# Patient Record
Sex: Male | Born: 1950 | Race: Asian | Hispanic: No | Marital: Single | Smoking: Never smoker
Health system: Southern US, Community
[De-identification: ages and names within clinical notes are randomized; demographics above are authoritative.]

## PROBLEM LIST (undated history)

## (undated) DIAGNOSIS — R079 Chest pain, unspecified: Secondary | ICD-10-CM

## (undated) DIAGNOSIS — E785 Hyperlipidemia, unspecified: Secondary | ICD-10-CM

## (undated) DIAGNOSIS — Z45018 Encounter for adjustment and management of other part of cardiac pacemaker: Secondary | ICD-10-CM

## (undated) DIAGNOSIS — I2510 Atherosclerotic heart disease of native coronary artery without angina pectoris: Secondary | ICD-10-CM

## (undated) DIAGNOSIS — R55 Syncope and collapse: Secondary | ICD-10-CM

## (undated) DIAGNOSIS — Z0389 Encounter for observation for other suspected diseases and conditions ruled out: Secondary | ICD-10-CM

## (undated) DIAGNOSIS — E1165 Type 2 diabetes mellitus with hyperglycemia: Secondary | ICD-10-CM

## (undated) DIAGNOSIS — R001 Bradycardia, unspecified: Secondary | ICD-10-CM

## (undated) DIAGNOSIS — R000 Tachycardia, unspecified: Secondary | ICD-10-CM

## (undated) HISTORY — DX: Type 2 diabetes mellitus with hyperglycemia: E11.65

## (undated) HISTORY — DX: Hyperlipidemia, unspecified: E78.5

## (undated) HISTORY — DX: Bradycardia, unspecified: R00.1

## (undated) HISTORY — DX: Syncope and collapse: R55

## (undated) HISTORY — DX: Encounter for observation for other suspected diseases and conditions ruled out: Z03.89

## (undated) HISTORY — DX: Encounter for adjustment and management of other part of cardiac pacemaker: Z45.018

## (undated) HISTORY — DX: Tachycardia, unspecified: R00.0

## (undated) HISTORY — DX: Chest pain, unspecified: R07.9

## (undated) HISTORY — DX: Atherosclerotic heart disease of native coronary artery without angina pectoris: I25.10

## (undated) HISTORY — PX: CARDIAC PACEMAKER PLACEMENT: SHX583

---

## 2003-11-17 ENCOUNTER — Encounter: Admission: RE | Admit: 2003-11-17 | Discharge: 2003-11-17 | Payer: Self-pay | Admitting: Internal Medicine

## 2004-02-03 ENCOUNTER — Encounter: Admission: RE | Admit: 2004-02-03 | Discharge: 2004-02-03 | Payer: Self-pay | Admitting: Internal Medicine

## 2006-03-12 ENCOUNTER — Ambulatory Visit (HOSPITAL_COMMUNITY): Admission: RE | Admit: 2006-03-12 | Discharge: 2006-03-12 | Payer: Self-pay | Admitting: Internal Medicine

## 2008-05-24 ENCOUNTER — Encounter: Admission: RE | Admit: 2008-05-24 | Discharge: 2008-05-24 | Payer: Self-pay | Admitting: Internal Medicine

## 2012-06-19 ENCOUNTER — Ambulatory Visit
Admission: RE | Admit: 2012-06-19 | Discharge: 2012-06-19 | Disposition: A | Discharge status: Home / Self Care | Payer: 59 | Source: Ambulatory Visit | Attending: Internal Medicine | Admitting: Internal Medicine

## 2012-06-19 ENCOUNTER — Other Ambulatory Visit: Payer: Self-pay | Admitting: Internal Medicine

## 2012-06-19 DIAGNOSIS — R109 Unspecified abdominal pain: Secondary | ICD-10-CM

## 2014-12-22 ENCOUNTER — Other Ambulatory Visit: Payer: Self-pay | Admitting: Internal Medicine

## 2014-12-22 ENCOUNTER — Ambulatory Visit
Admission: RE | Admit: 2014-12-22 | Discharge: 2014-12-22 | Disposition: A | Discharge status: Home / Self Care | Payer: Medicare Other | Source: Ambulatory Visit | Attending: Internal Medicine | Admitting: Internal Medicine

## 2014-12-22 DIAGNOSIS — R059 Cough, unspecified: Secondary | ICD-10-CM

## 2014-12-22 DIAGNOSIS — R05 Cough: Secondary | ICD-10-CM

## 2015-05-30 ENCOUNTER — Other Ambulatory Visit: Payer: Self-pay | Admitting: Internal Medicine

## 2015-05-30 ENCOUNTER — Ambulatory Visit
Admission: RE | Admit: 2015-05-30 | Discharge: 2015-05-30 | Disposition: A | Discharge status: Home / Self Care | Payer: Medicare Other | Source: Ambulatory Visit | Attending: Internal Medicine | Admitting: Internal Medicine

## 2015-05-30 DIAGNOSIS — R0602 Shortness of breath: Secondary | ICD-10-CM

## 2015-06-29 DIAGNOSIS — E1165 Type 2 diabetes mellitus with hyperglycemia: Secondary | ICD-10-CM

## 2015-06-29 DIAGNOSIS — E785 Hyperlipidemia, unspecified: Secondary | ICD-10-CM

## 2015-06-29 DIAGNOSIS — Z45018 Encounter for adjustment and management of other part of cardiac pacemaker: Secondary | ICD-10-CM

## 2015-06-29 DIAGNOSIS — R079 Chest pain, unspecified: Secondary | ICD-10-CM

## 2015-06-29 DIAGNOSIS — R001 Bradycardia, unspecified: Secondary | ICD-10-CM

## 2015-06-29 HISTORY — DX: Encounter for adjustment and management of other part of cardiac pacemaker: Z45.018

## 2015-06-29 HISTORY — DX: Bradycardia, unspecified: R00.1

## 2015-06-29 HISTORY — DX: Chest pain, unspecified: R07.9

## 2015-06-29 HISTORY — DX: Type 2 diabetes mellitus with hyperglycemia: E11.65

## 2015-06-29 HISTORY — DX: Hyperlipidemia, unspecified: E78.5

## 2015-07-04 HISTORY — PX: CARDIAC CATHETERIZATION: SHX172

## 2015-07-13 DIAGNOSIS — I2510 Atherosclerotic heart disease of native coronary artery without angina pectoris: Secondary | ICD-10-CM

## 2015-07-13 HISTORY — DX: Atherosclerotic heart disease of native coronary artery without angina pectoris: I25.10

## 2017-10-26 DIAGNOSIS — R55 Syncope and collapse: Secondary | ICD-10-CM | POA: Insufficient documentation

## 2017-10-26 DIAGNOSIS — R000 Tachycardia, unspecified: Secondary | ICD-10-CM

## 2017-10-26 HISTORY — DX: Syncope and collapse: R55

## 2017-10-26 HISTORY — DX: Tachycardia, unspecified: R00.0

## 2017-10-29 ENCOUNTER — Ambulatory Visit
Admission: RE | Admit: 2017-10-29 | Discharge: 2017-10-29 | Disposition: A | Discharge status: Home / Self Care | Payer: 59 | Source: Ambulatory Visit | Attending: Internal Medicine | Admitting: Internal Medicine

## 2017-10-29 ENCOUNTER — Other Ambulatory Visit: Payer: Self-pay | Admitting: Internal Medicine

## 2017-10-29 DIAGNOSIS — R079 Chest pain, unspecified: Secondary | ICD-10-CM

## 2017-10-29 DIAGNOSIS — R0602 Shortness of breath: Secondary | ICD-10-CM

## 2018-04-30 DIAGNOSIS — Z0389 Encounter for observation for other suspected diseases and conditions ruled out: Secondary | ICD-10-CM

## 2018-04-30 DIAGNOSIS — IMO0001 Reserved for inherently not codable concepts without codable children: Secondary | ICD-10-CM

## 2018-04-30 HISTORY — DX: Reserved for inherently not codable concepts without codable children: IMO0001

## 2018-09-02 ENCOUNTER — Encounter: Payer: Self-pay | Admitting: *Deleted

## 2018-09-02 ENCOUNTER — Other Ambulatory Visit: Payer: Self-pay | Admitting: *Deleted

## 2018-09-04 ENCOUNTER — Ambulatory Visit (INDEPENDENT_AMBULATORY_CARE_PROVIDER_SITE_OTHER): Payer: 59 | Admitting: Cardiology

## 2018-09-04 ENCOUNTER — Ambulatory Visit: Payer: 59 | Admitting: Cardiology

## 2018-09-04 ENCOUNTER — Encounter: Payer: Self-pay | Admitting: Cardiology

## 2018-09-04 VITALS — BP 120/62 | HR 60 | Wt 162.2 lb

## 2018-09-04 DIAGNOSIS — I2510 Atherosclerotic heart disease of native coronary artery without angina pectoris: Secondary | ICD-10-CM

## 2018-09-04 DIAGNOSIS — Z45018 Encounter for adjustment and management of other part of cardiac pacemaker: Secondary | ICD-10-CM

## 2018-09-04 DIAGNOSIS — R079 Chest pain, unspecified: Secondary | ICD-10-CM

## 2018-09-04 DIAGNOSIS — E1165 Type 2 diabetes mellitus with hyperglycemia: Secondary | ICD-10-CM

## 2018-09-04 DIAGNOSIS — R001 Bradycardia, unspecified: Secondary | ICD-10-CM

## 2018-09-04 DIAGNOSIS — E785 Hyperlipidemia, unspecified: Secondary | ICD-10-CM

## 2018-09-04 NOTE — Patient Instructions (Signed)
Medication Instructions:  Your physician recommends that you continue on your current medications as directed. Please refer to the Current Medication list given to you today.  If you need a refill on your cardiac medications before your next appointment, please call your pharmacy.   Lab work: None.  If you have labs (blood work) drawn today and your tests are completely normal, you will receive your results only by: Marland Kitchen. MyChart Message (if you have MyChart) OR . A paper copy in the mail If you have any lab test that is abnormal or we need to change your treatment, we will call you to review the results.  Testing/Procedures: Your physician has requested that you have an echocardiogram. Echocardiography is a painless test that uses sound waves to create images of your heart. It provides your doctor with information about the size and shape of your heart and how well your heart's chambers and valves are working. This procedure takes approximately one hour. There are no restrictions for this procedure.    Follow-Up: At [facility], you and your health needs are our priority.  As part of our continuing mission to provide you with exceptional heart care, we have created designated Provider Care Teams.  These Care Teams include your primary Cardiologist (physician) and Advanced Practice Providers (APPs -  Physician Assistants and Nurse Practitioners) who all work together to provide you with the care you need, when you need it. You will need a follow up appointment in 4 months.  Please call our office 2 months in advance to schedule this appointment.  You may see No primary care provider on file. or another member of our [facility] Provider Team in [place]: Norman HerrlichBrian Munley, MD . Belva Cromeajan Revankar, MD  Any Other Special Instructions Will Be Listed Below (If Applicable).  Dr. Bing MatterKrasowski has referred you to see Dr. Elberta Fortisamnitz with electrophysiology.   Echocardiogram An echocardiogram, or  echocardiography, uses sound waves (ultrasound) to produce an image of your heart. The echocardiogram is simple, painless, obtained within a short period of time, and offers valuable information to your health care provider. The images from an echocardiogram can provide information such as:  Evidence of coronary artery disease (CAD).  Heart size.  Heart muscle function.  Heart valve function.  Aneurysm detection.  Evidence of a past heart attack.  Fluid buildup around the heart.  Heart muscle thickening.  Assess heart valve function.  Tell a health care provider about:  Any allergies you have.  All medicines you are taking, including vitamins, herbs, eye drops, creams, and over-the-counter medicines.  Any problems you or family members have had with anesthetic medicines.  Any blood disorders you have.  Any surgeries you have had.  Any medical conditions you have.  Whether you are pregnant or may be pregnant. What happens before the procedure? No special preparation is needed. Eat and drink normally. What happens during the procedure?  In order to produce an image of your heart, gel will be applied to your chest and a wand-like tool (transducer) will be moved over your chest. The gel will help transmit the sound waves from the transducer. The sound waves will harmlessly bounce off your heart to allow the heart images to be captured in real-time motion. These images will then be recorded.  You may need an IV to receive a medicine that improves the quality of the pictures. What happens after the procedure? You may return to your normal schedule including diet, activities, and medicines, unless your health  care provider tells you otherwise. This information is not intended to replace advice given to you by your health care provider. Make sure you discuss any questions you have with your health care provider. Document Released: 10/04/2000 Document Revised: 05/25/2016 Document  Reviewed: 06/14/2013 Elsevier Interactive Patient Education  2017 [employer].

## 2018-09-04 NOTE — Progress Notes (Signed)
Cardiology Office Note:    Date:  09/04/2018   ID:  Terry LynchPhilip Joyce, DOB 04/10/1951, MRN [id number]  PCP:  Terry OkMoreira, Roy, MD  Cardiologist:  Gypsy Balsamobert Krasowski, MD    Referring MD: Terry OkMoreira, Roy, MD   Chief Complaint  Patient presents with  . Follow-up  Doing well  History of Present Illness:    Terry Joyce is a 67 y.o. male with poorly controlled diabetes.  He did have a cardiac catheterization few years ago which showed normal coronaries he does have pacemaker which is an Abbot  device that the last time check in March he had a 1.1-year left in the device.  He denies have any cardiac complaints no chest pain tightness squeezing pressure burning chest he described numbness in the right arm with radiation going towards his little finger he sustained some injury in his back many years ago and he thinks that this is related to this injury and he is most likely light right he is active trying to walk climb stairs no difficulty doing it does yoga every day and does some additional exercises with no difficulties the biggest problem is always poorly controlled diabetes.  Past Medical History:  Diagnosis Date  . 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 Coronary artery disease involving native coronary artery of native heart without angina pectoris 07/13/2015   Overview:  Luminal disease by cardiac catheter from September 2016  . Dyslipidemia 06/29/2015  . Exertional chest pain 06/29/2015  . Normal coronary arteries 04/30/2018  . Pacemaker reprogramming/check 06/29/2015  . Poorly controlled diabetes mellitus (HCC) 06/29/2015  . Pre-syncope 10/26/2017  . Tachycardia 10/26/2017      Current Medications: Current Meds  Medication Sig  . aspirin EC 81 MG tablet Take by mouth daily.  Marland Kitchen. atorvastatin (LIPITOR) 10 MG tablet Take by mouth.  . EPINEPHrine (EPIPEN JR) 0.15 MG/0.3ML injection Inject as directed.  . insulin aspart (NOVOLOG) 100 UNIT/ML FlexPen Inject into the skin daily.  . insulin degludec (TRESIBA)  100 UNIT/ML SOPN FlexTouch Pen Inject into the skin daily.     Allergies:   Penicillins   Social History   Socioeconomic History  . Marital status: Single    Spouse name: Not on file  . Number of children: Not on file  . Years of education: Not on file  . Highest education level: Not on file  Occupational History  . Not on file  Social Needs  . Financial resource strain: Not on file  . Food insecurity:    Worry: Not on file    Inability: Not on file  . Transportation needs:    Medical: Not on file    Non-medical: Not on file  Tobacco Use  . Smoking status: Never Smoker  . Smokeless tobacco: Never Used  Substance and Sexual Activity  . Alcohol use: Yes  . Drug use: Not Currently  . Sexual activity: Not on file  Lifestyle  . Physical activity:    Days per week: Not on file    Minutes per session: Not on file  . Stress: Not on file  Relationships  . Social connections:    Talks on phone: Not on file    Gets together: Not on file    Attends religious service: Not on file    Active member of club or organization: Not on file    Attends meetings of clubs or organizations: Not on file    Relationship status: Not on file  Other Topics Concern  . Not on file  Social History  Narrative  . Not on file     Family History: The patient's family history includes Colon cancer in his father; Diabetes in his mother. ROS:   Please see the history of present illness.    All 14 point review of systems negative except as described per history of present illness  EKGs/Labs/Other Studies Reviewed:      Recent Labs: No results found for requested labs within last 8760 hours.  Recent Lipid Panel No results found for: CHOL, TRIG, HDL, CHOLHDL, VLDL, LDLCALC, LDLDIRECT  Physical Exam:    VS:  BP 120/62   Pulse 60   Wt 162 lb 3.2 oz (73.6 kg)   SpO2 96%     Wt Readings from Last 3 Encounters:  09/04/18 162 lb 3.2 oz (73.6 kg)     GEN:  Well nourished, well developed in no  acute distress HEENT: Normal NECK: No JVD; No carotid bruits LYMPHATICS: No lymphadenopathy CARDIAC: RRR, no murmurs, no rubs, no gallops RESPIRATORY:  Clear to auscultation without rales, wheezing or rhonchi  ABDOMEN: Soft, non-tender, non-distended MUSCULOSKELETAL:  No edema; No deformity  SKIN: Warm and dry LOWER EXTREMITIES: no swelling NEUROLOGIC:  Alert and oriented x 3 PSYCHIATRIC:  Normal affect   ASSESSMENT:    1. Dyslipidemia   2. Bradycardia   3. Poorly controlled diabetes mellitus (HCC)    PLAN:    In order of problems listed above:  1. Pacemaker present we will make arrangements for pacemaker interrogation then will enroll him in our pacemaker clinic. 2. Poorly controlled diabetes.  He is working hard with primary care physician and endocrinologist trying to improve it.  Will continue to push towards better control of this problem.  He blames it on very unscheduled EP habits. 3. Bradycardia may be addressed with pacemaker. 4. He does have some chest pain which is very atypical twinges like he had it even before when I did a cardiac catheterization which was negative for ischemia. 5. I will schedule him to have echocardiogram to assess left ventricular ejection fraction especially since he does have some exertional shortness of breath.   Medication Adjustments/Labs and Tests Ordered: Current medicines are reviewed at length with the patient today.  Concerns regarding medicines are outlined above.  No orders of the defined types were placed in this encounter.  Medication changes: No orders of the defined types were placed in this encounter.   Signed, Georgeanna Lea, MD, [facility] 09/04/2018 11:07 AM     [facility]

## 2018-09-09 ENCOUNTER — Encounter: Payer: 59 | Admitting: Cardiology

## 2018-10-22 ENCOUNTER — Ambulatory Visit (INDEPENDENT_AMBULATORY_CARE_PROVIDER_SITE_OTHER): Payer: 59

## 2018-10-22 DIAGNOSIS — I2510 Atherosclerotic heart disease of native coronary artery without angina pectoris: Secondary | ICD-10-CM | POA: Diagnosis not present

## 2018-10-22 NOTE — Progress Notes (Signed)
Complete echocardiogram has been performed.  Jimmy Kallin Henk RDCS, RVT 

## 2018-10-30 ENCOUNTER — Telehealth: Payer: Self-pay | Admitting: Emergency Medicine

## 2018-10-30 NOTE — Telephone Encounter (Signed)
Left results of echocardiogram on patient voicemail per dpr. Advised him to call back with any questions

## 2018-11-09 ENCOUNTER — Encounter: Payer: 59 | Admitting: Cardiology

## 2018-11-09 NOTE — Progress Notes (Deleted)
Electrophysiology Office Note   Date:  11/09/2018   ID:  Chrisotpher Joyce, DOB 05/04/1951, MRN [id number]  PCP:  Ralene Ok, MD  Cardiologist:  Bing Matter Primary Electrophysiologist:   Jorja Loa, MD    No chief complaint on file.    History of Present Illness: Terry Joyce is a 68 y.o. male who is being seen today for the evaluation of pacemaker at the request of Gypsy Balsam. Presenting today for electrophysiology evaluation.  He has a history significant for coronary artery disease, bradycardia status post [facility] pacemaker, diabetes, hyperlipidemia.  Today, he denies*** symptoms of palpitations, chest pain, shortness of breath, orthopnea, PND, lower extremity edema, claudication, dizziness, presyncope, syncope, bleeding, or neurologic sequela. The patient is tolerating medications without difficulties.    Past Medical History:  Diagnosis Date  . 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 Coronary artery disease involving native coronary artery of native heart without angina pectoris 07/13/2015   Overview:  Luminal disease by cardiac catheter from September 2016  . Dyslipidemia 06/29/2015  . Exertional chest pain 06/29/2015  . Normal coronary arteries 04/30/2018  . Pacemaker reprogramming/check 06/29/2015  . Poorly controlled diabetes mellitus (HCC) 06/29/2015  . Pre-syncope 10/26/2017  . Tachycardia 10/26/2017   *** The histories are not reviewed yet. Please review them in the "History" navigator section and refresh this SmartLink.   Current Outpatient Medications  Medication Sig Dispense Refill  . aspirin EC 81 MG tablet Take by mouth daily.    Marland Kitchen atorvastatin (LIPITOR) 10 MG tablet Take by mouth.    . EPINEPHrine (EPIPEN JR) 0.15 MG/0.3ML injection Inject as directed.    . insulin aspart (NOVOLOG) 100 UNIT/ML FlexPen Inject into the skin daily.    . insulin degludec (TRESIBA) 100 UNIT/ML SOPN FlexTouch Pen Inject into the skin daily.     No current facility-administered  medications for this visit.     Allergies:   Penicillins   Social History:  The patient  reports that he has never smoked. He has never used smokeless tobacco. He reports current alcohol use. He reports previous drug use.   Family History:  The patient's ***family history includes Colon cancer in his father; Diabetes in his mother.    ROS:  Please see the history of present illness.   Otherwise, review of systems is positive for ***.   All other systems are reviewed and negative.    PHYSICAL EXAM: VS:  There were no vitals taken for this visit. , BMI There is no height or weight on file to calculate BMI. GEN: Well nourished, well developed, in no acute distress  HEENT: normal  Neck: no JVD, carotid bruits, or masses Cardiac: ***RRR; no murmurs, rubs, or gallops,no edema  Respiratory:  clear to auscultation bilaterally, normal work of breathing GI: soft, nontender, nondistended, + BS MS: no deformity or atrophy  Skin: warm and dry, device pocket is well healed Neuro:  Strength and sensation are intact Psych: euthymic mood, full affect  EKG:  EKG {ACTION; IS/IS ATF:57322025} ordered today. Personal review of the ekg ordered shows ***  Device interrogation is reviewed today in detail.  See PaceArt for details.   Recent Labs: No results found for requested labs within last 8760 hours.    Lipid Panel  No results found for: CHOL, TRIG, HDL, CHOLHDL, VLDL, LDLCALC, LDLDIRECT   Wt Readings from Last 3 Encounters:  09/04/18 162 lb 3.2 oz (73.6 kg)      Other studies Reviewed: Additional studies/ records that were reviewed today include: TTE  10/22/18  Review of the above records today demonstrates:  - Left ventricle: The cavity size was normal. Wall thickness was   normal. Systolic function was normal. The estimated ejection   fraction was in the range of 60% to 65%. Wall motion was normal;   there were no regional wall motion abnormalities. Features are   consistent with a  pseudonormal left ventricular filling pattern,   with concomitant abnormal relaxation and increased filling   pressure (grade 2 diastolic dysfunction). - Right ventricle: Pacer wire or catheter noted in right ventricle. - Right atrium: Pacer wire or catheter noted in right atrium.   ASSESSMENT AND PLAN:  1.  Sick sinus syndrome: Status post Saint Jude dual-chamber pacemaker.***  2.  Hyperlipidemia: Plan per primary cardiology    Current medicines are reviewed at length with the patient today.   The patient {ACTIONS; HAS/DOES NOT HAVE:19233} concerns regarding his medicines.  The following changes were made today:  {NONE DEFAULTED:18576::"none"}  Labs/ tests ordered today include: *** No orders of the defined types were placed in this encounter.    Disposition:   FU with   {gen number [id number]} {Days to years:10300}  Signed,  Jorja LoaMartin , MD  11/09/2018 11:33 AM     [facility] [address] [place] [address] [phone] (office) [phone] (fax)

## 2018-11-24 NOTE — Progress Notes (Signed)
This encounter was created in error - please disregard.

## 2018-11-30 IMAGING — CR DG CHEST 2V
2 series · 2 of 2 positions shown · non-contrast
Comparison: 07/04/2015 and prior radiographs

CLINICAL DATA: Left chest pain and shortness of breath for 1 week.

EXAM:
CHEST  2 VIEW

[w chest pa]
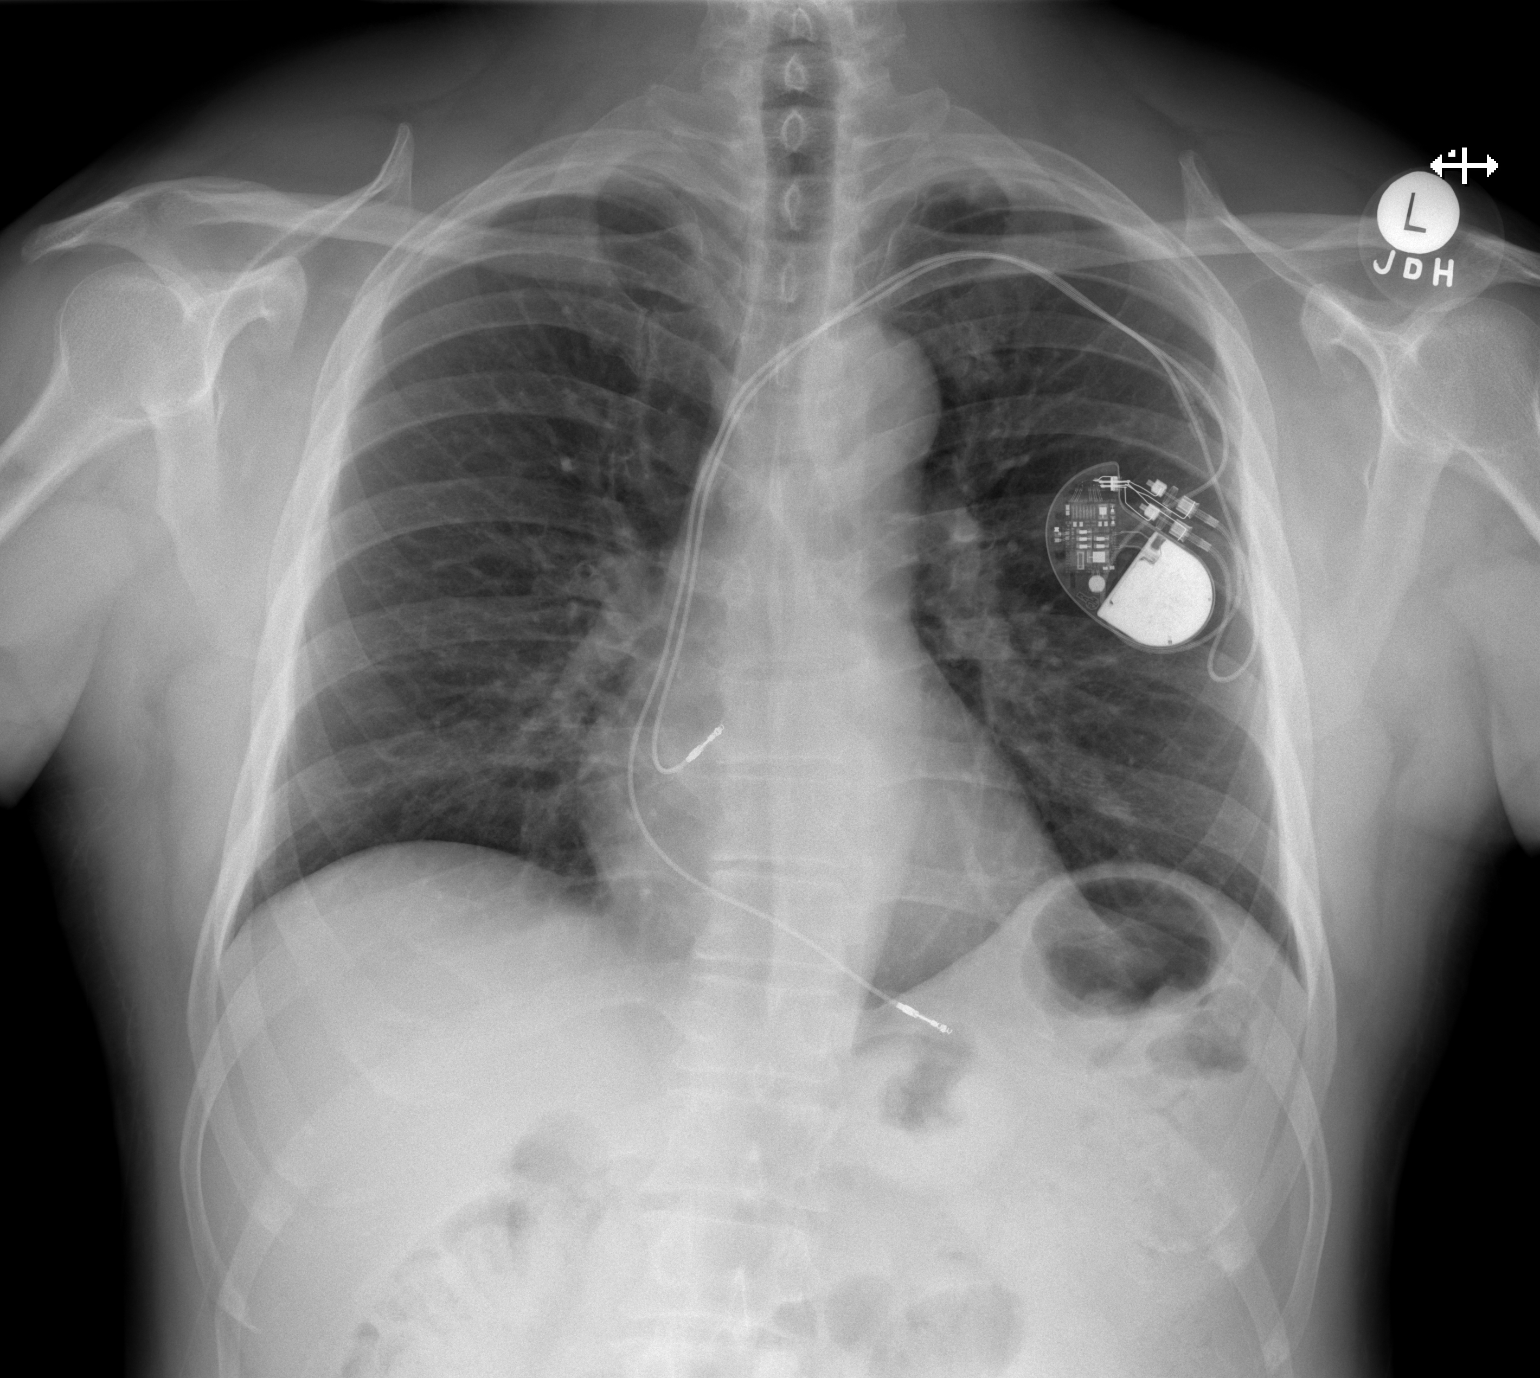

[w chest lat]
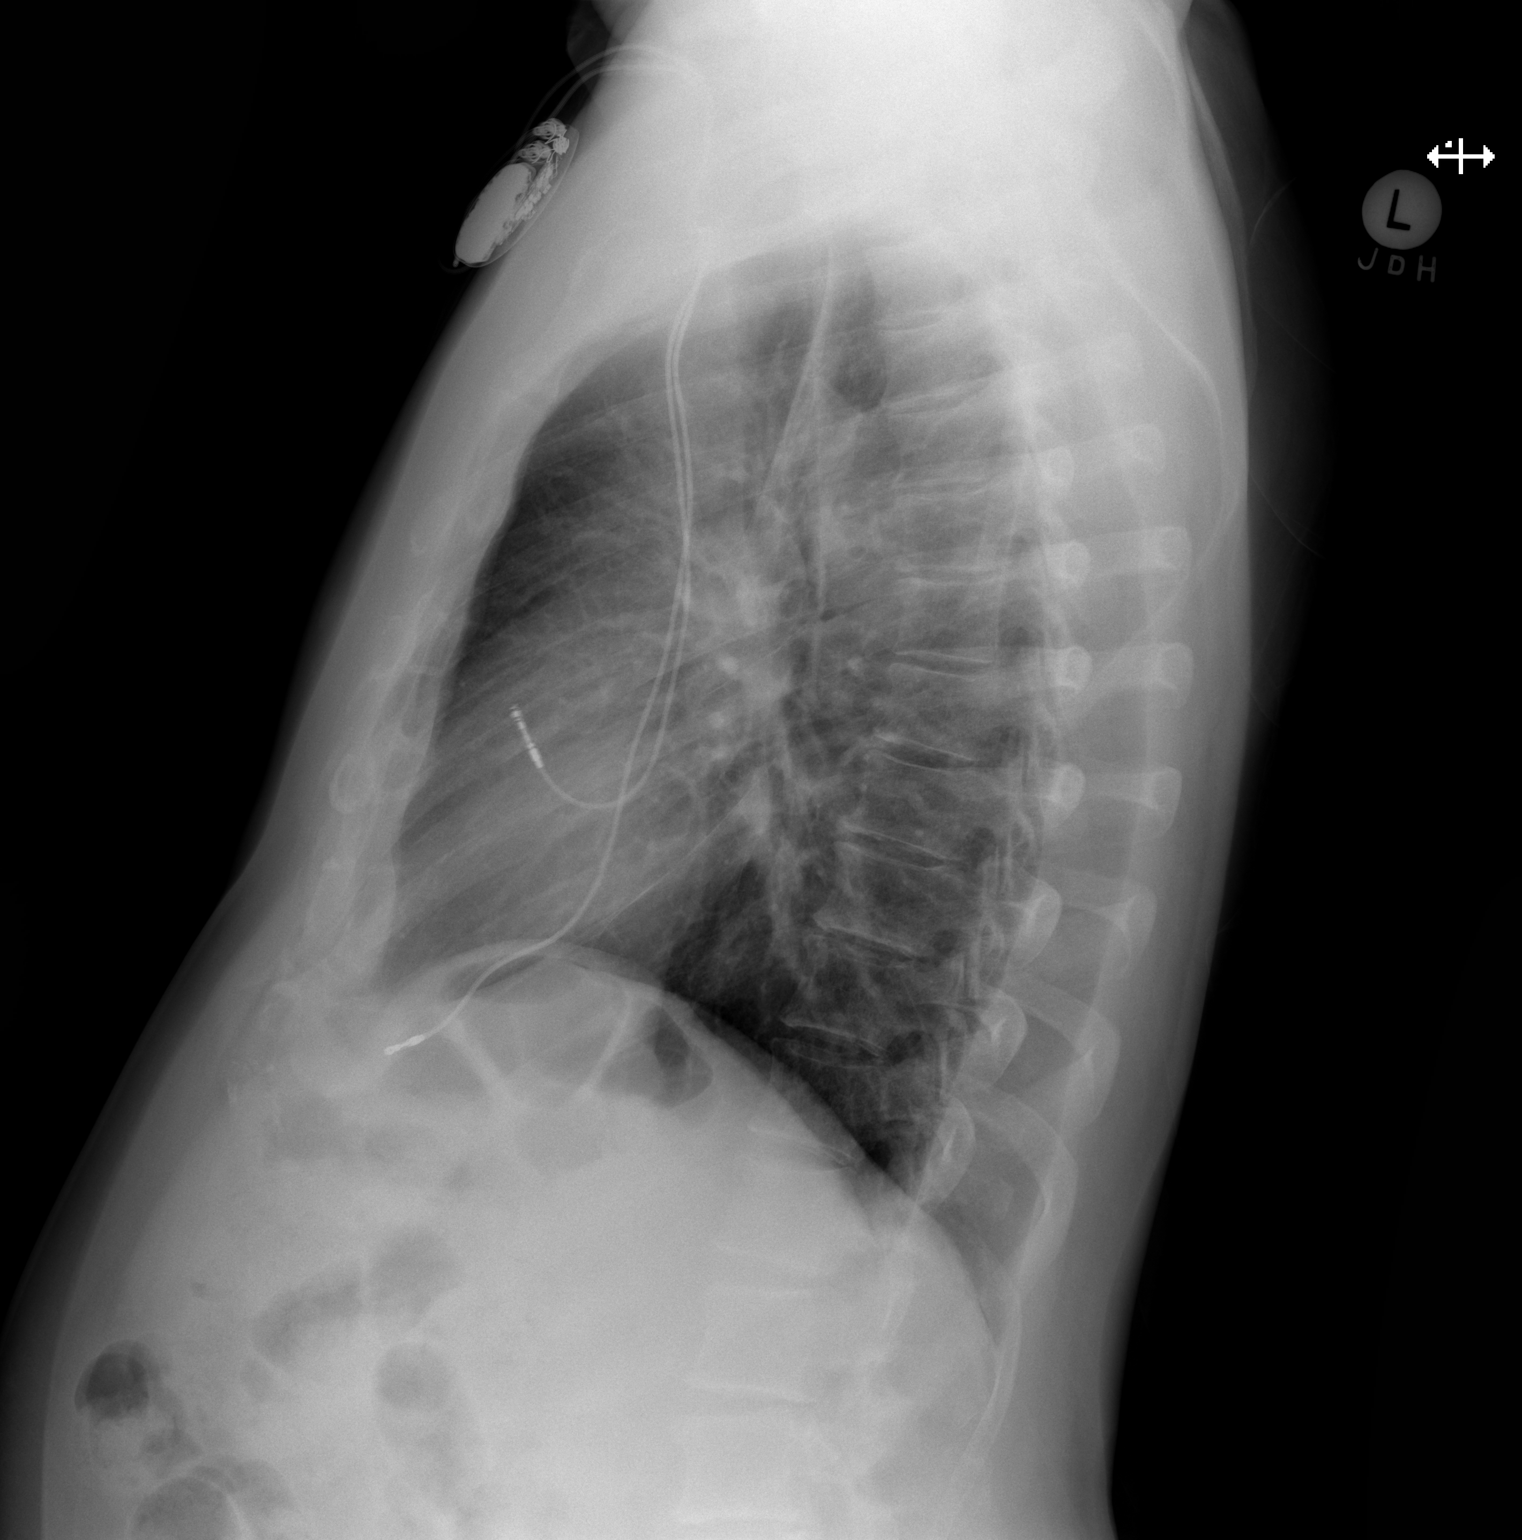

[2 of 2 positions shown; findings below may reference images not displayed]

FINDINGS: The cardiomediastinal silhouette is unremarkable.

Left-sided pacemaker is unchanged.

There is no evidence of focal airspace disease, pulmonary edema,
suspicious pulmonary nodule/mass, pleural effusion, or pneumothorax.

No acute bony abnormalities are identified.
IMPRESSION: No active cardiopulmonary disease.

## 2018-12-10 ENCOUNTER — Encounter: Payer: Self-pay | Admitting: Cardiology

## 2018-12-10 ENCOUNTER — Ambulatory Visit (INDEPENDENT_AMBULATORY_CARE_PROVIDER_SITE_OTHER): Payer: 59 | Admitting: Cardiology

## 2018-12-10 VITALS — BP 120/60 | HR 62 | Ht 64.0 in | Wt 165.4 lb

## 2018-12-10 DIAGNOSIS — IMO0001 Reserved for inherently not codable concepts without codable children: Secondary | ICD-10-CM

## 2018-12-10 DIAGNOSIS — Z0389 Encounter for observation for other suspected diseases and conditions ruled out: Secondary | ICD-10-CM | POA: Diagnosis not present

## 2018-12-10 DIAGNOSIS — R079 Chest pain, unspecified: Secondary | ICD-10-CM | POA: Diagnosis not present

## 2018-12-10 DIAGNOSIS — Z950 Presence of cardiac pacemaker: Secondary | ICD-10-CM

## 2018-12-10 DIAGNOSIS — R0789 Other chest pain: Secondary | ICD-10-CM

## 2018-12-10 DIAGNOSIS — E1165 Type 2 diabetes mellitus with hyperglycemia: Secondary | ICD-10-CM

## 2018-12-10 HISTORY — DX: Other chest pain: R07.89

## 2018-12-10 HISTORY — DX: Presence of cardiac pacemaker: Z95.0

## 2018-12-10 NOTE — Patient Instructions (Addendum)
Medication Instructions:  Your physician recommends that you continue on your current medications as directed. Please refer to the Current Medication list given to you today.  If you need a refill on your cardiac medications before your next appointment, please call your pharmacy.   Lab work: Your physician recommends that you return for lab work today: cbc, with diff, sedimentation rate  If you have labs (blood work) drawn today and your tests are completely normal, you will receive your results only by: Marland Kitchen MyChart Message (if you have MyChart) OR . A paper copy in the mail If you have any lab test that is abnormal or we need to change your treatment, we will call you to review the results.  Testing/Procedures: Your physician has requested that you have a upper extremity venous duplex.  Allow one hour for this exam. There are no restrictions or special instructions.   Follow-Up: At [facility], you and your health needs are our priority.  As part of our continuing mission to provide you with exceptional heart care, we have created designated Provider Care Teams.  These Care Teams include your primary Cardiologist (physician) and Advanced Practice Providers (APPs -  Physician Assistants and Nurse Practitioners) who all work together to provide you with the care you need, when you need it. You will need a follow up appointment in 1 months.  Please call our office 2 months in advance to schedule this appointment.  You may see No primary care provider on file. or another member of our [facility] Provider Team in [place]: Norman Herrlich, MD . Belva Crome, MD  Any Other Special Instructions Will Be Listed Below (If Applicable).   You are scheduled Monday at 830 to have you pacemaker interrogated in [place].

## 2018-12-10 NOTE — Progress Notes (Signed)
Cardiology Office Note:    Date:  12/10/2018   ID:  Terry Joyce, DOB 11/03/1950, MRN [id number]  PCP:  Jilda Panda, MD  Cardiologist:  Jenne Campus, MD    Referring MD: Jilda Panda, MD   Chief Complaint  Patient presents with  . Check pacemaker site    Having pain in the location of the pacemaker and into the shoulder, feels vibration  I have a pain in the pacemaker area  History of Present Illness:    Terry Joyce is a 68 y.o. male with pacemaker, diabetes which is poorly controlled requested to be seen because of pain around pacemaker site.  He is working hard in the garden and apparently few weeks ago he was using a chainsaw since that time he has been complaining of having pain around pacemaker area.  A look at the side of the pacemaker looks pristine there is no swelling there is no redness there is minimal tenderness on palpation he is pointing especially in the area where leads are attached to the pacemaker.  On top of that he is complained of having some pain in the left shoulder moving shoulder will give him more pain also pain going towards the neck he is complaining having headaches and he thinks that these headaches are related to pacemaker he also noted that his diabetes appears to be much more difficult to control lately he is worried that he may have infection in the pacemaker site.  I told him that would be rather unlikely to have infection of the pacemaker in such a stable device that he has already for more than 10 years.  However I think it would be reasonable to perform some blood test to see if there is any evidence of infection or inflammation.  I will also schedule him to have ultrasounds of the new system of the left shoulder to make sure there is no acute thrombosis that is responsible for his symptoms likely his arm is not swollen pulses are normal.  Otherwise he seems to be doing well  Past Medical History:  Diagnosis Date  . 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 Coronary artery disease involving native coronary artery of native heart without angina pectoris 07/13/2015   Overview:  Luminal disease by cardiac catheter from September 2016  . Dyslipidemia 06/29/2015  . Exertional chest pain 06/29/2015  . Normal coronary arteries 04/30/2018  . Pacemaker reprogramming/check 06/29/2015  . Poorly controlled diabetes mellitus ([place]) 06/29/2015  . Pre-syncope 10/26/2017  . Tachycardia 10/26/2017      Current Medications: Current Meds  Medication Sig  . aspirin EC 81 MG tablet Take by mouth daily.  Marland Kitchen EPINEPHrine (EPIPEN JR) 0.15 MG/0.3ML injection Inject as directed.  Marland Kitchen FIASP FLEXTOUCH 100 UNIT/ML SOPN   . insulin degludec (TRESIBA) 100 UNIT/ML SOPN FlexTouch Pen Inject into the skin daily.     Allergies:   Penicillins   Social History   Socioeconomic History  . Marital status: Single    Spouse name: Not on file  . Number of children: Not on file  . Years of education: Not on file  . Highest education level: Not on file  Occupational History  . Not on file  Social Needs  . Financial resource strain: Not on file  . Food insecurity:    Worry: Not on file    Inability: Not on file  . Transportation needs:    Medical: Not on file    Non-medical: Not on file  Tobacco Use  .  Smoking status: Never Smoker  . Smokeless tobacco: Never Used  Substance and Sexual Activity  . Alcohol use: Yes  . Drug use: Not Currently  . Sexual activity: Not on file  Lifestyle  . Physical activity:    Days per week: Not on file    Minutes per session: Not on file  . Stress: Not on file  Relationships  . Social connections:    Talks on phone: Not on file    Gets together: Not on file    Attends religious service: Not on file    Active member of club or organization: Not on file    Attends meetings of clubs or organizations: Not on file    Relationship status: Not on file  Other Topics Concern  . Not on file  Social History Narrative  . Not on file     Family  History: The patient's family history includes Colon cancer in his father; Diabetes in his mother. ROS:   Please see the history of present illness.    All 14 point review of systems negative except as described per history of present illness  EKGs/Labs/Other Studies Reviewed:      Recent Labs: No results found for requested labs within last 8760 hours.  Recent Lipid Panel No results found for: CHOL, TRIG, HDL, CHOLHDL, VLDL, LDLCALC, LDLDIRECT  Physical Exam:    VS:  BP 120/60   Pulse 62   Ht 5' 4"  (1.626 m)   Wt 165 lb 6.4 oz (75 kg)   SpO2 96%   BMI 28.39 kg/m     Wt Readings from Last 3 Encounters:  12/10/18 165 lb 6.4 oz (75 kg)  09/04/18 162 lb 3.2 oz (73.6 kg)     GEN:  Well nourished, well developed in no acute distress HEENT: Normal NECK: No JVD; No carotid bruits LYMPHATICS: No lymphadenopathy CARDIAC: RRR, no murmurs, no rubs, no gallops RESPIRATORY:  Clear to auscultation without rales, wheezing or rhonchi  ABDOMEN: Soft, non-tender, non-distended MUSCULOSKELETAL:  No edema; No deformity  SKIN: Warm and dry LOWER EXTREMITIES: no swelling NEUROLOGIC:  Alert and oriented x 3 PSYCHIATRIC:  Normal affect   ASSESSMENT:    1. Exertional chest pain   2. Atypical chest pain   3. Pacemaker   4. Normal coronary arteries   5. Poorly controlled diabetes mellitus ([place])    PLAN:    In order of problems listed above:  1. Atypical chest pain.  Not related to his heart in the pacemaker area I suspect musculoskeletal however will do ultrasounds of his subclavian vein make sure there is no significant thrombosis there.  Overall I doubt that his symptoms are related to pacemaker. 2. Normal coronary arteries: Noted last cardiac catheterization. 3. Diabetes which is difficult to control again he had some discussion about this he is very reluctant to take any medications in the matter-of-fact told him to take some Tylenol for shoulder pain he does not want to do  that   Medication Adjustments/Labs and Tests Ordered: Current medicines are reviewed at length with the patient today.  Concerns regarding medicines are outlined above.  Orders Placed This Encounter  Procedures  . CBC with Differential  . Sed Rate (ESR)   Medication changes: No orders of the defined types were placed in this encounter.   Signed, Park Liter, MD, [facility] 12/10/2018 11:54 AM    [place]

## 2018-12-11 LAB — CBC WITH DIFFERENTIAL/PLATELET
BASOS: 1 %
Basophils Absolute: 0.1 10*3/uL (ref 0.0–0.2)
EOS (ABSOLUTE): 0.3 10*3/uL (ref 0.0–0.4)
EOS: 4 %
HEMATOCRIT: 45.1 % (ref 37.5–51.0)
HEMOGLOBIN: 15.4 g/dL (ref 13.0–17.7)
Immature Grans (Abs): 0 10*3/uL (ref 0.0–0.1)
Immature Granulocytes: 1 %
LYMPHS ABS: 2.1 10*3/uL (ref 0.7–3.1)
LYMPHS: 32 %
MCH: 29.8 pg (ref 26.6–33.0)
MCHC: 34.1 g/dL (ref 31.5–35.7)
MCV: 87 fL (ref 79–97)
MONOCYTES: 8 %
Monocytes Absolute: 0.5 10*3/uL (ref 0.1–0.9)
NEUTROS PCT: 54 %
Neutrophils Absolute: 3.5 10*3/uL (ref 1.4–7.0)
Platelets: 194 10*3/uL (ref 150–450)
RBC: 5.17 x10E6/uL (ref 4.14–5.80)
RDW: 12.7 % (ref 11.6–15.4)
WBC: 6.4 10*3/uL (ref 3.4–10.8)

## 2018-12-11 LAB — SEDIMENTATION RATE: Sed Rate: 5 mm/hr (ref 0–30)

## 2018-12-14 ENCOUNTER — Telehealth: Payer: Self-pay | Admitting: Emergency Medicine

## 2018-12-14 DIAGNOSIS — Z950 Presence of cardiac pacemaker: Secondary | ICD-10-CM

## 2018-12-14 NOTE — Telephone Encounter (Signed)
Patient had pacemaker interrogation today. He needs a new pacemaker I have sent staff messages to Dory Horn, RN and Glynda Jaeger to get him scheduled soon.

## 2018-12-14 NOTE — Addendum Note (Signed)
Addended by: Lita Mains on: 12/14/2018 04:48 PM   Modules accepted: Orders

## 2018-12-22 ENCOUNTER — Ambulatory Visit (INDEPENDENT_AMBULATORY_CARE_PROVIDER_SITE_OTHER): Payer: 59 | Admitting: Cardiology

## 2018-12-22 ENCOUNTER — Encounter: Payer: Self-pay | Admitting: Cardiology

## 2018-12-22 ENCOUNTER — Ambulatory Visit (HOSPITAL_BASED_OUTPATIENT_CLINIC_OR_DEPARTMENT_OTHER)
Admission: RE | Admit: 2018-12-22 | Discharge: 2018-12-22 | Disposition: A | Discharge status: Home / Self Care | Payer: 59 | Source: Ambulatory Visit | Attending: Cardiology | Admitting: Cardiology

## 2018-12-22 VITALS — BP 132/74 | HR 54 | Ht 64.0 in | Wt 167.0 lb

## 2018-12-22 DIAGNOSIS — I495 Sick sinus syndrome: Secondary | ICD-10-CM

## 2018-12-22 DIAGNOSIS — Z01812 Encounter for preprocedural laboratory examination: Secondary | ICD-10-CM | POA: Diagnosis not present

## 2018-12-22 DIAGNOSIS — R079 Chest pain, unspecified: Secondary | ICD-10-CM | POA: Diagnosis present

## 2018-12-22 LAB — CUP PACEART INCLINIC DEVICE CHECK
MDC IDC PG IMPLANT DT: 20090402
MDC IDC SESS DTM: 20200303123445
Pulse Gen Serial Number: 1190957

## 2018-12-22 NOTE — Addendum Note (Signed)
Addended by: Baird Lyons on: 12/22/2018 01:15 PM   Modules accepted: Orders

## 2018-12-22 NOTE — Addendum Note (Signed)
Addended by: Marylou Flesher on: 12/22/2018 01:32 PM   Modules accepted: Orders

## 2018-12-22 NOTE — Progress Notes (Signed)
Electrophysiology Office Note   Date:  12/22/2018   ID:  Terry Joyce, DOB [birth year], MRN [id number]  PCP:  Ralene Ok, MD  Cardiologist:  Bing Matter Primary Electrophysiologist:  Silus Lanzo Jorja Loa, MD    No chief complaint on file.    History of Present Illness: Terry Joyce is a 68 y.o. male who is being seen today for the evaluation of pacemkaer at the request of Gypsy Balsam. Presenting today for electrophysiology evaluation.  He has a history of coronary artery disease, hyperlipidemia, diabetes.  Today, he denies symptoms of palpitations, chest pain, shortness of breath, orthopnea, PND, lower extremity edema, claudication, dizziness, presyncope, syncope, bleeding, or neurologic sequela. The patient is tolerating medications without difficulties.  The patient's main complaint is pain over his pacemaker site.  This is been going on for many months.  There is no obvious sign of infection based on his prior history.  Labs were tested and he does not have a current white count.   Past Medical History:  Diagnosis Date  . 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 Coronary artery disease involving native coronary artery of native heart without angina pectoris 07/13/2015   Overview:  Luminal disease by cardiac catheter from September 2016  . Dyslipidemia 06/29/2015  . Exertional chest pain 06/29/2015  . Normal coronary arteries 04/30/2018  . Pacemaker reprogramming/check 06/29/2015  . Poorly controlled diabetes mellitus (HCC) 06/29/2015  . Pre-syncope 10/26/2017  . Tachycardia 10/26/2017   Past Surgical History:  Procedure Laterality Date  . CARDIAC CATHETERIZATION Left 07/04/2015   Surgeon: Derrek Gu, MD; Location: [facility] CATH; Service: Cardiology  . CARDIAC PACEMAKER PLACEMENT       Current Outpatient Medications  Medication Sig Dispense Refill  . aspirin EC 81 MG tablet Take by mouth daily.    Marland Kitchen EPINEPHrine (EPIPEN JR) 0.15 MG/0.3ML injection Inject as directed.    Marland Kitchen FIASP  FLEXTOUCH 100 UNIT/ML SOPN     . insulin degludec (TRESIBA) 100 UNIT/ML SOPN FlexTouch Pen Inject into the skin daily.     No current facility-administered medications for this visit.     Allergies:   Penicillins   Social History:  The patient  reports that he has never smoked. He has never used smokeless tobacco. He reports current alcohol use. He reports previous drug use.   Family History:  The patient's family history includes Colon cancer in his father; Diabetes in his mother.    ROS:  Please see the history of present illness.   Otherwise, review of systems is positive for muscle pain.   All other systems are reviewed and negative.    PHYSICAL EXAM: VS:  BP 132/74   Pulse (!) 54   Ht 5\' 4"  (1.626 m)   Wt 167 lb (75.8 kg)   BMI 28.67 kg/m  , BMI Body mass index is 28.67 kg/m. GEN: Well nourished, well developed, in no acute distress  HEENT: normal  Neck: no JVD, carotid bruits, or masses Cardiac: RRR; no murmurs, rubs, or gallops,no edema  Respiratory:  clear to auscultation bilaterally, normal work of breathing GI: soft, nontender, nondistended, + BS MS: no deformity or atrophy  Skin: warm and dry, device pocket is well healed Neuro:  Strength and sensation are intact Psych: euthymic mood, full affect  EKG:  EKG is ordered today. Personal review of the ekg ordered shows atrial paced, rate 54   Device interrogation is reviewed today in detail.  See PaceArt for details.   Recent Labs: 12/10/2018: Hemoglobin 15.4; Platelets 194  Lipid Panel  No results found for: CHOL, TRIG, HDL, CHOLHDL, VLDL, LDLCALC, LDLDIRECT   Wt Readings from Last 3 Encounters:  12/22/18 167 lb (75.8 kg)  12/10/18 165 lb 6.4 oz (75 kg)  09/04/18 162 lb 3.2 oz (73.6 kg)      Other studies Reviewed: Additional studies/ records that were reviewed today include: TTE 10/22/18  Review of the above records today demonstrates:  - Left ventricle: The cavity size was normal. Wall thickness  was   normal. Systolic function was normal. The estimated ejection   fraction was in the range of 60% to 65%. Wall motion was normal;   there were no regional wall motion abnormalities. Features are   consistent with a pseudonormal left ventricular filling pattern,   with concomitant abnormal relaxation and increased filling   pressure (grade 2 diastolic dysfunction). - Right ventricle: Pacer wire or catheter noted in right ventricle. - Right atrium: Pacer wire or catheter noted in right atrium.  Left heart catheterization 2016 Essentially normal coronary arteries. Very minor plaque and some minor dilatation. Normal LV function.  ASSESSMENT AND PLAN:  1.  Sick sinus syndrome:  Status post Saint Jude dual-chamber pacemaker.  Device is at [facility].  We Terry Joyce plan for generator change.  Risks and benefits were discussed include bleeding and infection.  The patient understands these risks and is agreed to the procedure.  Of note the patient does have some pain over his generator site.  The site looks well-healed without signs of obvious infection.  He has had an ultrasound of the site which we were reviewed prior to his generator change.  Case discussed with primary cardiology      Current medicines are reviewed at length with the patient today.   The patient does not have concerns regarding his medicines.  The following changes were made today:  none  Labs/ tests ordered today include:  Orders Placed This Encounter  Procedures  . CUP PACEART INCLINIC DEVICE CHECK  . EKG 12-Lead     Disposition:   FU with Catharine Kettlewell 3 months  Signed, Leandro Berkowitz Jorja Loa, MD  12/22/2018 12:57 PM     [facility] [address] [place] [address] [phone] (office) [phone] (fax)

## 2018-12-22 NOTE — Patient Instructions (Addendum)
Medication Instructions:  Your physician recommends that you continue on your current medications as directed. Please refer to the Current Medication list given to you today.  * If you need a refill on your cardiac medications before your next appointment, please call your pharmacy.   Labwork: Pre procedure labs today: BMET & CBC *We will only notify you of abnormal results, otherwise continue current treatment plan.  Testing/Procedures: Your physician has recommended that you have a pacemaker battery change.  See the instructions below.  Follow-Up: Your physician recommends that you schedule a follow-up appointment in: 10-14 days, after your procedure on 01/14/2019, with device clinic for a wound check.  Your physician recommends that you schedule a follow-up appointment in: 3 months, after your procedure on 01/14/2019, with Dr. Elberta Fortis.  Thank you for choosing [facility]!!   Dory Horn, RN [phone]  Any Other Special Instructions Will Be Listed Below (If Applicable).   Implantable Device Instructions  You are scheduled for:                  _____ Pacemaker battery change  on  01/14/2019.  with Dr. Elberta Fortis.  1.   Please arrive at the [facility] Entr[facility]"  at [facility] at  5:30 a.m. on the day of your procedure. (The address is [address])  2. Do not eat or drink after midnight the night before your procedure.  3.   Complete pre procedure  lab work on 12/22/2018.  The lab at [facility] is open from 8:00 AM to 4:30 PM.  You do not have to be fasting.  4.   Hold all of your morning medications the morning of your procedure.  5.  Plan for an overnight stay.  Bring your insurance cards and a list of you medications.  6.  Wash your chest and neck with surgical scrub the evening before and the morning of  your procedure.  Rinse well. Please review the surgical scrub instruction sheet given to you.  7. Your chest will need to be  shaved prior to this procedure (if needed). We ask that you do this yourself at home 1 to 2 days before or if uncomfortable/unable to do yourself, then it will be performed by the hospital staff the day of.                                                                                                                * If you have ANY questions after you get home, please call Dory Horn, RN @ [phone].  * Every attempt is made to prevent procedures from being rescheduled.  Due to the nature of  Electrophysiology, rescheduling can happen.  The physician is always aware and directs the staff when this occurs.

## 2018-12-22 NOTE — Progress Notes (Signed)
Upper Extremity Venous Duplex  Right: No evidence of deep vein thrombosis in the upper extremity. No evidence of superficial vein thrombosis in the upper extremity. No evidence of thrombosis in the subclavian.  Left: No evidence of deep vein thrombosis in the upper extremity. No evidence of superficial vein thrombosis in the upper extremity. No evidence of thrombosis in the subclavian.     12/22/18  Sinda Du RDCS, RVT

## 2018-12-23 LAB — CBC
Hematocrit: 44.7 % (ref 37.5–51.0)
Hemoglobin: 15.1 g/dL (ref 13.0–17.7)
MCH: 29 pg (ref 26.6–33.0)
MCHC: 33.8 g/dL (ref 31.5–35.7)
MCV: 86 fL (ref 79–97)
PLATELETS: 203 10*3/uL (ref 150–450)
RBC: 5.2 x10E6/uL (ref 4.14–5.80)
RDW: 13.1 % (ref 11.6–15.4)
WBC: 7.8 10*3/uL (ref 3.4–10.8)

## 2018-12-23 LAB — BASIC METABOLIC PANEL
BUN/Creatinine Ratio: 12 (ref 10–24)
BUN: 14 mg/dL (ref 8–27)
CHLORIDE: 103 mmol/L (ref 96–106)
CO2: 22 mmol/L (ref 20–29)
Calcium: 9.7 mg/dL (ref 8.6–10.2)
Creatinine, Ser: 1.18 mg/dL (ref 0.76–1.27)
GFR calc Af Amer: 73 mL/min/{1.73_m2} (ref >59)
GFR calc non Af Amer: 63 mL/min/{1.73_m2} (ref >59)
GLUCOSE: 58 mg/dL — AB (ref 65–99)
POTASSIUM: 4.7 mmol/L (ref 3.5–5.2)
Sodium: 140 mmol/L (ref 134–144)

## 2018-12-28 ENCOUNTER — Telehealth: Payer: Self-pay | Admitting: *Deleted

## 2018-12-28 NOTE — Telephone Encounter (Signed)
Informed pt of time change on 3/26. Pt advised to arrive at 7:30 a.m. on 3/26 for his PPM gen change. Patient verbalized understanding and agreeable to plan.

## 2019-01-14 ENCOUNTER — Ambulatory Visit (HOSPITAL_COMMUNITY)
Admission: RE | Disposition: A | Discharge status: Home / Self Care | Payer: Self-pay | Source: Home / Self Care | Attending: Cardiology

## 2019-01-14 ENCOUNTER — Ambulatory Visit (HOSPITAL_COMMUNITY): Payer: 59

## 2019-01-14 ENCOUNTER — Other Ambulatory Visit: Payer: Self-pay

## 2019-01-14 ENCOUNTER — Ambulatory Visit (HOSPITAL_COMMUNITY)
Admission: RE | Admit: 2019-01-14 | Discharge: 2019-01-14 | Disposition: A | Discharge status: Home / Self Care | Payer: 59 | Attending: Cardiology | Admitting: Cardiology

## 2019-01-14 DIAGNOSIS — Z833 Family history of diabetes mellitus: Secondary | ICD-10-CM | POA: Insufficient documentation

## 2019-01-14 DIAGNOSIS — E785 Hyperlipidemia, unspecified: Secondary | ICD-10-CM | POA: Insufficient documentation

## 2019-01-14 DIAGNOSIS — Z95818 Presence of other cardiac implants and grafts: Secondary | ICD-10-CM

## 2019-01-14 DIAGNOSIS — Z880 Allergy status to penicillin: Secondary | ICD-10-CM | POA: Diagnosis not present

## 2019-01-14 DIAGNOSIS — Z7982 Long term (current) use of aspirin: Secondary | ICD-10-CM | POA: Insufficient documentation

## 2019-01-14 DIAGNOSIS — I495 Sick sinus syndrome: Secondary | ICD-10-CM | POA: Insufficient documentation

## 2019-01-14 DIAGNOSIS — I2510 Atherosclerotic heart disease of native coronary artery without angina pectoris: Secondary | ICD-10-CM | POA: Diagnosis not present

## 2019-01-14 DIAGNOSIS — E119 Type 2 diabetes mellitus without complications: Secondary | ICD-10-CM | POA: Diagnosis not present

## 2019-01-14 DIAGNOSIS — Z794 Long term (current) use of insulin: Secondary | ICD-10-CM | POA: Diagnosis not present

## 2019-01-14 DIAGNOSIS — Z4502 Encounter for adjustment and management of automatic implantable cardiac defibrillator: Secondary | ICD-10-CM | POA: Diagnosis present

## 2019-01-14 DIAGNOSIS — T82110A Breakdown (mechanical) of cardiac electrode, initial encounter: Secondary | ICD-10-CM

## 2019-01-14 DIAGNOSIS — Z45010 Encounter for checking and testing of cardiac pacemaker pulse generator [battery]: Secondary | ICD-10-CM

## 2019-01-14 HISTORY — PX: LEAD INSERTION: EP1212

## 2019-01-14 HISTORY — PX: PPM GENERATOR CHANGEOUT: EP1233

## 2019-01-14 LAB — GLUCOSE, CAPILLARY
Glucose-Capillary: 191 mg/dL — ABNORMAL HIGH (ref 70–99)
Glucose-Capillary: 189 mg/dL — ABNORMAL HIGH (ref 70–99)

## 2019-01-14 LAB — SURGICAL PCR SCREEN
MRSA, PCR: NEGATIVE
Staphylococcus aureus: NEGATIVE

## 2019-01-14 SURGERY — PPM GENERATOR CHANGEOUT

## 2019-01-14 MED ORDER — VANCOMYCIN HCL IN DEXTROSE 1-5 GM/200ML-% IV SOLN
INTRAVENOUS | Status: AC
  Filled 2019-01-14: qty 200

## 2019-01-14 MED ORDER — SODIUM CHLORIDE 0.9 % IV SOLN
INTRAVENOUS | Status: AC
  Filled 2019-01-14: qty 2

## 2019-01-14 MED ORDER — ACETAMINOPHEN 325 MG PO TABS: 325.0000 mg | ORAL_TABLET | ORAL | Status: DC | PRN

## 2019-01-14 MED ORDER — ONDANSETRON HCL 4 MG/2ML IJ SOLN: 4.0000 mg | Freq: Four times a day (QID) | INTRAMUSCULAR | Status: DC | PRN

## 2019-01-14 MED ORDER — SODIUM CHLORIDE 0.9 % IV SOLN
80.0000 mg | INTRAVENOUS | Status: AC
  Administered 2019-01-14: 80 mg
  Filled 2019-01-14: qty 2

## 2019-01-14 MED ORDER — IOHEXOL 350 MG/ML SOLN
INTRAVENOUS | Status: DC | PRN
  Administered 2019-01-14: 15 mL via INTRAVENOUS

## 2019-01-14 MED ORDER — MUPIROCIN 2 % EX OINT
TOPICAL_OINTMENT | CUTANEOUS | Status: AC
  Administered 2019-01-14: 08:00:00
  Filled 2019-01-14: qty 22

## 2019-01-14 MED ORDER — VANCOMYCIN HCL 1000 MG IV SOLR
1000.0000 mg | INTRAVENOUS | Status: AC
  Administered 2019-01-14: 1000 mg via INTRAVENOUS
  Filled 2019-01-14: qty 1000

## 2019-01-14 MED ORDER — LIDOCAINE HCL (PF) 1 % IJ SOLN
INTRAMUSCULAR | Status: DC | PRN
  Administered 2019-01-14: 60 mL

## 2019-01-14 MED ORDER — LIDOCAINE HCL 1 % IJ SOLN
INTRAMUSCULAR | Status: AC
  Filled 2019-01-14: qty 60

## 2019-01-14 MED ORDER — SODIUM CHLORIDE 0.9 % IV SOLN
INTRAVENOUS | Status: DC
  Administered 2019-01-14: 08:00:00 via INTRAVENOUS

## 2019-01-14 MED ORDER — MIDAZOLAM HCL 5 MG/5ML IJ SOLN
INTRAMUSCULAR | Status: AC
  Filled 2019-01-14: qty 5

## 2019-01-14 MED ORDER — CHLORHEXIDINE GLUCONATE 4 % EX LIQD
60.0000 mL | Freq: Once | CUTANEOUS | Status: DC
  Filled 2019-01-14: qty 60

## 2019-01-14 MED ORDER — HEPARIN (PORCINE) IN NACL 1000-0.9 UT/500ML-% IV SOLN
INTRAVENOUS | Status: DC | PRN
  Administered 2019-01-14: 500 mL

## 2019-01-14 MED ORDER — VANCOMYCIN HCL 1000 MG IV SOLR: 1000.0000 mg | Freq: Two times a day (BID) | INTRAVENOUS | Status: DC

## 2019-01-14 MED ORDER — FENTANYL CITRATE (PF) 100 MCG/2ML IJ SOLN
INTRAMUSCULAR | Status: AC
  Filled 2019-01-14: qty 2

## 2019-01-14 SURGICAL SUPPLY — 5 items
CABLE SURGICAL S-101-97-12 (CABLE) ×4 IMPLANT
LEAD TENDRIL MRI 46CM LPA1200M (Lead) ×4 IMPLANT
PACEMAKER ASSURITY DR-RF (Pacemaker) ×4 IMPLANT
PAD PRO RADIOLUCENT 2001M-C (PAD) ×4 IMPLANT
TRAY PACEMAKER INSERTION (PACKS) ×4 IMPLANT

## 2019-01-14 NOTE — Progress Notes (Signed)
PCR screen negative

## 2019-01-14 NOTE — Progress Notes (Signed)
Pt was concerned about his blood glucose level.  BG level is 191.

## 2019-01-14 NOTE — Discharge Instructions (Signed)
° ° ° °  01/15/2019 (TOMORROW), PLEASE SEND A REMOTE DEVICE TRANSMISSION  and  PLEASE SIGN UP TO YOUR MY CHART ACCOUNT WHEN YOU GET HOME (if you aren't already).  IN THE CURRENT ENVIRONMENT WITH COVID-19, IN EFFORT TO REDUCE YOUR EXPOSURE WE WILL BE CONDUCTING MANY PATIENT VISITS BY EITHER ELECTRONIC/VIRTUAL VISITS or TELEPHONE VISITS.  BEING SIGNED UP IN YOUR MY CHART ACCOUNT  WILL HELP FACILITATE THESE VISITS AND OUR COMMUNICATION WITH YOU      Supplemental Discharge Instructions for  Pacemaker/Defibrillator Patients  Activity No heavy lifting or vigorous activity with your left/right arm for 6 to 8 weeks.  Do not raise your left/right arm above your head for one week.  Gradually raise your affected arm as drawn below.              01/18/2019                 01/19/2019               01/20/2019                01/21/2019 __  NO DRIVING for  1 week   ; you may begin driving on  01/25/961   .  WOUND CARE - Remove arm sling tomorrow morning, 01/15/2019 - Remove outer plastic dressing tomorrow 01/15/2019, the paper tapes (steri-strips) underneath are to remain in place - Keep the wound area clean and dry.  Do not get this area wet, no showers until cleared to at your wound check visist . - The tape/steri-strips on your wound will fall off; do not pull them off.  No bandage is needed on the site.  DO  NOT apply any creams, oils, or ointments to the wound area. - If you notice any drainage or discharge from the wound, any swelling or bruising at the site, or you develop a fever > 101? F after you are discharged home, call the office at once.  Special Instructions - You are still able to use cellular telephones; use the ear opposite the side where you have your pacemaker/defibrillator.  Avoid carrying your cellular phone near your device. - When traveling through airports, show security personnel your identification card to avoid being screened in the metal detectors.  Ask the security personnel to use  the hand wand. - Avoid arc welding equipment, MRI testing (magnetic resonance imaging), TENS units (transcutaneous nerve stimulators).  Call the office for questions about other devices. - Avoid electrical appliances that are in poor condition or are not properly grounded. - Microwave ovens are safe to be near or to operate.

## 2019-01-14 NOTE — Progress Notes (Signed)
Spoke with Dr. Elberta Fortis - ok for patient to go at 1700

## 2019-01-14 NOTE — H&P (Signed)
Terry Joyce has presented today for surgery, with the diagnosis of sick sinus syndrome.  The various methods of treatment have been discussed with the patient and family. After consideration of risks, benefits and other options for treatment, the patient has consented to  Procedure(s): Pacemaker generator change as a surgical intervention .  Risks include but not limited to bleeding, tamponade, infection, pneumothorax, among others. The patient's history has been reviewed, patient examined, no change in status, stable for surgery.  I have reviewed the patient's chart and labs.  Questions were answered to the patient's satisfaction.    [name], MD 01/14/2019 8:51 AM

## 2019-01-14 NOTE — Progress Notes (Signed)
During interrogation of device, there was a concern in reference to the insulation of the atrial lead.  Device and leads were fluoroscopied and it was decided to place a new atrial lead.

## 2019-01-15 ENCOUNTER — Encounter (HOSPITAL_COMMUNITY): Payer: Self-pay | Admitting: Cardiology

## 2019-01-15 MED FILL — Lidocaine HCl Local Inj 1%: INTRAMUSCULAR | Qty: 60 | Status: AC

## 2019-01-15 MED FILL — Midazolam HCl Inj 5 MG/5ML (Base Equivalent): INTRAMUSCULAR | Qty: 5 | Status: AC

## 2019-01-15 MED FILL — Fentanyl Citrate Preservative Free (PF) Inj 100 MCG/2ML: INTRAMUSCULAR | Qty: 2 | Status: AC

## 2019-01-20 ENCOUNTER — Telehealth: Payer: Self-pay

## 2019-01-20 NOTE — Telephone Encounter (Signed)
Called and spoke with patient, he was able to set up mychart. Information sent for visit on Tuesday. Patient has a computer and will do webex through computer. Advised if he has any questions between now and Tuesday to call.

## 2019-01-20 NOTE — Telephone Encounter (Signed)
I called and spoke with patient, he will download mychart and webex. Mychart activation code sent via text message. I will call patient back this afternoon to see if he was able to download mychart and webex.

## 2019-01-22 ENCOUNTER — Telehealth: Payer: Self-pay | Admitting: Cardiology

## 2019-01-22 NOTE — Telephone Encounter (Signed)
Pt just had an implant and does not need follow up per Dr. Bing Matter.

## 2019-01-25 ENCOUNTER — Ambulatory Visit: Payer: 59

## 2019-01-26 ENCOUNTER — Encounter: Payer: Self-pay | Admitting: *Deleted

## 2019-01-26 ENCOUNTER — Other Ambulatory Visit: Payer: Self-pay

## 2019-01-26 ENCOUNTER — Telehealth (INDEPENDENT_AMBULATORY_CARE_PROVIDER_SITE_OTHER): Payer: 59 | Admitting: *Deleted

## 2019-01-26 DIAGNOSIS — R001 Bradycardia, unspecified: Secondary | ICD-10-CM | POA: Diagnosis not present

## 2019-01-26 DIAGNOSIS — Z950 Presence of cardiac pacemaker: Secondary | ICD-10-CM

## 2019-01-26 NOTE — Progress Notes (Signed)
PPM wound check appointment via video visit. Steri-strips removed by patient. Wound without redness or edema. Left lateral incision edges appear superficially unapproximated, small dot of blood noted. No evidence of infection. Advised patient to apply bandaid and keep site covered for at least 24hrs, apply pressure and call if bleeding becomes more steady, or if signs/symptoms of infection are noted. Plan for wound recheck via video visit on 02/02/19.  Manual transmission sent in for review of device function. Thresholds not tested. Sensing and impedance trends stable and consistent with implant measurements. RA output at 5.0V for extra safety margin until 3 month visit; RV output at chronic settings. Histogram distribution appropriate for patient and level of activity. No mode switches or high ventricular rates noted. Patient educated about wound care, arm mobility, lifting restrictions, and Merlin monitor. ROV with WC in 3 months.  Patient gave verbal permission to include the following photo:

## 2019-01-27 LAB — CUP PACEART REMOTE DEVICE CHECK
Battery Remaining Longevity: 68 mo
Battery Remaining Percentage: 95.5 %
Battery Voltage: 3.1 V
Brady Statistic AP VP Percent: 1 %
Brady Statistic AP VS Percent: 99 %
Brady Statistic AS VP Percent: 0 %
Brady Statistic AS VS Percent: 1 %
Brady Statistic RA Percent Paced: 98 %
Brady Statistic RV Percent Paced: 1 %
Date Time Interrogation Session: 20200407142636
Implantable Lead Implant Date: 20090402
Implantable Lead Implant Date: 20200326
Implantable Lead Location: 753859
Implantable Lead Location: 753860
Implantable Pulse Generator Implant Date: 20200326
Lead Channel Impedance Value: 440 Ohm
Lead Channel Impedance Value: 510 Ohm
Lead Channel Pacing Threshold Amplitude: 0.5 V
Lead Channel Pacing Threshold Amplitude: 1.25 V
Lead Channel Pacing Threshold Pulse Width: 0.5 ms
Lead Channel Pacing Threshold Pulse Width: 0.5 ms
Lead Channel Sensing Intrinsic Amplitude: 2.5 mV
Lead Channel Sensing Intrinsic Amplitude: 4.3 mV
Lead Channel Setting Pacing Amplitude: 2.5 V
Lead Channel Setting Pacing Amplitude: 5 V
Lead Channel Setting Pacing Pulse Width: 0.5 ms
Lead Channel Setting Sensing Sensitivity: 0.7 mV
Pulse Gen Model: 2272
Pulse Gen Serial Number: 9120360

## 2019-01-27 NOTE — Patient Instructions (Signed)
After Your Pacemaker  . Monitor your pacemaker site for redness, swelling, and drainage. Call the device clinic at [phone] if you experience these symptoms or fever/chills.  . If your incision is closed with Steri-strips or staples:  You may shower 7 days after your procedure and wash your incision with soap and water. Avoid lotions, ointments, or perfumes over your incision until it is well-healed.  . You may use a hot tub or a pool after your wound check appointment if the incision is completely closed.  . Do not lift over 10 pounds with the affected arm until 6 weeks after your procedure. There are no other restrictions in arm movement after your wound check appointment.  . You may drive, unless driving has been restricted by your healthcare providers.  . Your Pacemaker IS NOT MRI compatible.  . Remote monitoring is used to monitor your pacemaker from home. This monitoring is scheduled every 91 days by our office. It allows [place] to keep an eye on the functioning of your device to ensure it is working properly. You will routinely see your Electrophysiologist annually (more often if necessary).

## 2019-01-27 NOTE — Telephone Encounter (Signed)
Opened in error

## 2019-02-01 ENCOUNTER — Telehealth: Payer: 59 | Admitting: Cardiology

## 2019-02-02 ENCOUNTER — Telehealth (INDEPENDENT_AMBULATORY_CARE_PROVIDER_SITE_OTHER): Payer: 59 | Admitting: *Deleted

## 2019-02-02 ENCOUNTER — Other Ambulatory Visit: Payer: Self-pay

## 2019-02-02 DIAGNOSIS — R001 Bradycardia, unspecified: Secondary | ICD-10-CM

## 2019-02-02 DIAGNOSIS — Z950 Presence of cardiac pacemaker: Secondary | ICD-10-CM

## 2019-02-02 NOTE — Progress Notes (Signed)
Patient verbally consented to PPM wound recheck via video visit due to Covid-19.  Reassessed incision via video visit. Incision edges appear fully approximated, patient denies drainage, redness, or swelling. Patient reports some warmth at site, no fever or chills. Patient also notes a "bump" along incision. Unable to assess via video. Recommended wound recheck in office. Patient is agreeable. Will need to have Dr. Elberta Fortis available if necessary. Advised patient I will call him back after confirming availability.  Plan for wound recheck in office on 02/03/19 at 2:00pm. Pt is agreeable to plan and is aware of office address.

## 2019-02-03 ENCOUNTER — Ambulatory Visit: Payer: 59

## 2019-02-03 ENCOUNTER — Telehealth: Payer: Self-pay | Admitting: Cardiology

## 2019-02-03 NOTE — Telephone Encounter (Signed)
Patient called and wanted to know if his appt for today could be scheduled later today. Informed pt that someone will call him back to later to discuss.

## 2019-02-03 NOTE — Telephone Encounter (Signed)
Spoke with patient. He requests to reschedule wound recheck appointment as he had a scheduling conflict today. He reports "bump" at site is improving. No drainage, redness, swelling, fever/chills. Some itching at incision that comes and goes. Advised I would still like to assess his site in person as he reported some warmth during video visit yesterday. Pt agreeable to rescheduling appointment to Monday, 02/08/19 at 1:30pm.  Pt asks if safe to use tractor and ride-on lawn mower at this point, reports both have power steering. Advised safe to use but reminded of LUE weight restrictions for the first 6 weeks post-implant. Pt verbalizes understanding and denies additional questions or concerns at this time.

## 2019-02-08 ENCOUNTER — Other Ambulatory Visit: Payer: Self-pay

## 2019-02-08 ENCOUNTER — Ambulatory Visit (INDEPENDENT_AMBULATORY_CARE_PROVIDER_SITE_OTHER): Payer: 59 | Admitting: *Deleted

## 2019-02-08 DIAGNOSIS — I495 Sick sinus syndrome: Secondary | ICD-10-CM

## 2019-02-08 DIAGNOSIS — Z950 Presence of cardiac pacemaker: Secondary | ICD-10-CM | POA: Diagnosis not present

## 2019-02-08 LAB — CUP PACEART INCLINIC DEVICE CHECK
Battery Remaining Longevity: 81 mo
Battery Voltage: 3.1 V
Brady Statistic RA Percent Paced: 97 %
Brady Statistic RV Percent Paced: 0.85 %
Date Time Interrogation Session: 20200420165504
Implantable Lead Implant Date: 20090402
Implantable Lead Implant Date: 20200326
Implantable Lead Location: 753859
Implantable Lead Location: 753860
Implantable Pulse Generator Implant Date: 20200326
Lead Channel Impedance Value: 462.5 Ohm
Lead Channel Impedance Value: 525 Ohm
Lead Channel Pacing Threshold Amplitude: 0.5 V
Lead Channel Pacing Threshold Amplitude: 0.5 V
Lead Channel Pacing Threshold Amplitude: 1.5 V
Lead Channel Pacing Threshold Amplitude: 1.5 V
Lead Channel Pacing Threshold Pulse Width: 0.5 ms
Lead Channel Pacing Threshold Pulse Width: 0.5 ms
Lead Channel Pacing Threshold Pulse Width: 0.5 ms
Lead Channel Pacing Threshold Pulse Width: 0.5 ms
Lead Channel Sensing Intrinsic Amplitude: 2.2 mV
Lead Channel Sensing Intrinsic Amplitude: 5.2 mV
Lead Channel Setting Pacing Amplitude: 2.5 V
Lead Channel Setting Pacing Amplitude: 3.5 V
Lead Channel Setting Pacing Pulse Width: 0.5 ms
Lead Channel Setting Sensing Sensitivity: 0.7 mV
Pulse Gen Model: 2272
Pulse Gen Serial Number: 9120360

## 2019-02-08 NOTE — Progress Notes (Signed)
  Patient gave verbal permission to include the following photo:    Note: unable to rotate photo.

## 2019-02-08 NOTE — Progress Notes (Signed)
Wound re-check appointment. Steri-strips previously removed by patient. Wound without redness or edema. Incision edges approximated, wound well healed. Normal device function. ,RA sensing, and impedances consistent with implant measurements .RV sensing and impedance stable, Rv threshold now 1.5v @ 0.5 ms. VP < 1 %, chronic lead,no changes to output today. RA  programmed at 3.5V for extra safety margin until 3 month visit. Histogram distribution appropriate for patient and level of activity. No mode switches or high ventricular rates noted. Patient educated about wound care, arm mobility, lifting restrictions. ROV in 3 months with implanting physician.

## 2019-03-23 ENCOUNTER — Other Ambulatory Visit: Payer: Self-pay | Admitting: Internal Medicine

## 2019-03-23 ENCOUNTER — Ambulatory Visit
Admission: RE | Admit: 2019-03-23 | Discharge: 2019-03-23 | Disposition: A | Discharge status: Home / Self Care | Payer: 59 | Source: Ambulatory Visit | Attending: Internal Medicine | Admitting: Internal Medicine

## 2019-03-23 DIAGNOSIS — R05 Cough: Secondary | ICD-10-CM

## 2019-03-23 DIAGNOSIS — R059 Cough, unspecified: Secondary | ICD-10-CM

## 2019-04-05 ENCOUNTER — Encounter: Payer: 59 | Admitting: Cardiology

## 2019-04-20 ENCOUNTER — Ambulatory Visit: Payer: 59 | Admitting: *Deleted

## 2019-04-20 DIAGNOSIS — I495 Sick sinus syndrome: Secondary | ICD-10-CM

## 2019-04-20 LAB — CUP PACEART REMOTE DEVICE CHECK
Battery Remaining Longevity: 80 mo
Battery Remaining Percentage: 95.5 %
Battery Voltage: 3.02 V
Brady Statistic AP VP Percent: 1.2 %
Brady Statistic AP VS Percent: 91 %
Brady Statistic AS VP Percent: 1 %
Brady Statistic AS VS Percent: 8.3 %
Brady Statistic RA Percent Paced: 90 %
Brady Statistic RV Percent Paced: 1.2 %
Date Time Interrogation Session: 20200630052856
Implantable Lead Implant Date: 20090402
Implantable Lead Implant Date: 20200326
Implantable Lead Location: 753859
Implantable Lead Location: 753860
Implantable Pulse Generator Implant Date: 20200326
Lead Channel Impedance Value: 450 Ohm
Lead Channel Impedance Value: 530 Ohm
Lead Channel Pacing Threshold Amplitude: 0.5 V
Lead Channel Pacing Threshold Amplitude: 1.5 V
Lead Channel Pacing Threshold Pulse Width: 0.5 ms
Lead Channel Pacing Threshold Pulse Width: 0.5 ms
Lead Channel Sensing Intrinsic Amplitude: 2.5 mV
Lead Channel Sensing Intrinsic Amplitude: 4.2 mV
Lead Channel Setting Pacing Amplitude: 2.5 V
Lead Channel Setting Pacing Amplitude: 3.5 V
Lead Channel Setting Pacing Pulse Width: 0.5 ms
Lead Channel Setting Sensing Sensitivity: 0.7 mV
Pulse Gen Model: 2272
Pulse Gen Serial Number: 9120360

## 2019-05-02 ENCOUNTER — Encounter: Payer: Self-pay | Admitting: Cardiology

## 2019-05-02 NOTE — Progress Notes (Signed)
Remote pacemaker transmission.   

## 2019-06-21 ENCOUNTER — Ambulatory Visit (INDEPENDENT_AMBULATORY_CARE_PROVIDER_SITE_OTHER): Payer: 59 | Admitting: *Deleted

## 2019-06-21 DIAGNOSIS — I495 Sick sinus syndrome: Secondary | ICD-10-CM | POA: Diagnosis not present

## 2019-06-25 LAB — CUP PACEART REMOTE DEVICE CHECK
Battery Remaining Longevity: 74 mo
Battery Remaining Percentage: 95.5 %
Battery Voltage: 3.01 V
Brady Statistic AP VP Percent: 1.5 %
Brady Statistic AP VS Percent: 92 %
Brady Statistic AS VP Percent: 1 %
Brady Statistic AS VS Percent: 6.1 %
Brady Statistic RA Percent Paced: 92 %
Brady Statistic RV Percent Paced: 1.5 %
Date Time Interrogation Session: 20200904090700
Implantable Lead Implant Date: 20090402
Implantable Lead Implant Date: 20200326
Implantable Lead Location: 753859
Implantable Lead Location: 753860
Implantable Pulse Generator Implant Date: 20200326
Lead Channel Impedance Value: 440 Ohm
Lead Channel Impedance Value: 490 Ohm
Lead Channel Pacing Threshold Amplitude: 0.5 V
Lead Channel Pacing Threshold Amplitude: 1.5 V
Lead Channel Pacing Threshold Pulse Width: 0.5 ms
Lead Channel Pacing Threshold Pulse Width: 0.5 ms
Lead Channel Sensing Intrinsic Amplitude: 2.4 mV
Lead Channel Sensing Intrinsic Amplitude: 4.5 mV
Lead Channel Setting Pacing Amplitude: 2.5 V
Lead Channel Setting Pacing Amplitude: 3.5 V
Lead Channel Setting Pacing Pulse Width: 0.5 ms
Lead Channel Setting Sensing Sensitivity: 0.7 mV
Pulse Gen Model: 2272
Pulse Gen Serial Number: 9120360

## 2019-06-30 ENCOUNTER — Encounter: Payer: Self-pay | Admitting: Cardiology

## 2019-06-30 NOTE — Progress Notes (Signed)
Remote pacemaker transmission.   

## 2019-09-07 ENCOUNTER — Ambulatory Visit: Payer: 59 | Admitting: Cardiology

## 2019-09-09 ENCOUNTER — Ambulatory Visit (INDEPENDENT_AMBULATORY_CARE_PROVIDER_SITE_OTHER): Payer: 59 | Admitting: Cardiology

## 2019-09-09 ENCOUNTER — Encounter: Payer: Self-pay | Admitting: Cardiology

## 2019-09-09 ENCOUNTER — Other Ambulatory Visit: Payer: Self-pay

## 2019-09-09 VITALS — BP 114/68 | HR 62 | Ht 63.0 in | Wt 169.0 lb

## 2019-09-09 DIAGNOSIS — E1165 Type 2 diabetes mellitus with hyperglycemia: Secondary | ICD-10-CM | POA: Diagnosis not present

## 2019-09-09 DIAGNOSIS — R001 Bradycardia, unspecified: Secondary | ICD-10-CM | POA: Diagnosis not present

## 2019-09-09 DIAGNOSIS — Z950 Presence of cardiac pacemaker: Secondary | ICD-10-CM | POA: Diagnosis not present

## 2019-09-09 DIAGNOSIS — R0600 Dyspnea, unspecified: Secondary | ICD-10-CM | POA: Diagnosis not present

## 2019-09-09 NOTE — Progress Notes (Signed)
Cardiology Office Note:    Date:  09/09/2019   ID:  Terry Joyce, DOB Oct 09, 1951, MRN [id number]  PCP:  Terry Panda, MD  Cardiologist:  Terry Campus, MD    Referring MD: Terry Panda, MD   Chief Complaint  Patient presents with  . Follow-up  Doing well  History of Present Illness:    Terry Joyce is a 68 y.o. male: Terry Joyce comes today to my for follow-up overall cardiac wise appears to be doing well denies have any chest pain, tightness, pressure, burning in the chest.  Biggest complaint that he has is weakness fatigue.  Also with exercise he will get short of breath quite easily.  He did have COVID-19 infection few months ago recovered completely from that only mild symptoms when he had it.  Taking better care of his diabetes.  Still does not take aspirin does not want to take any statin.  I will call primary care physician to get his fasting lipid profile.  Past Medical History:  Diagnosis Date  . 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 Coronary artery disease involving native coronary artery of native heart without angina pectoris 07/13/2015   Overview:  Luminal disease by cardiac catheter from September 2016  . Dyslipidemia 06/29/2015  . Exertional chest pain 06/29/2015  . Normal coronary arteries 04/30/2018  . Pacemaker reprogramming/check 06/29/2015  . Poorly controlled diabetes mellitus ([place]) 06/29/2015  . Pre-syncope 10/26/2017  . Tachycardia 10/26/2017    Past Surgical History:  Procedure Laterality Date  . CARDIAC CATHETERIZATION Left 07/04/2015   Surgeon: Terry Spearman, MD; Location: [facility] CATH; Service: Cardiology  . CARDIAC PACEMAKER PLACEMENT    . LEAD INSERTION Left 01/14/2019   Procedure: LEAD INSERTION;  Surgeon: Terry Haw, MD;  Location: [place] [facility];  Service: Cardiovascular;  Laterality: Left;  . PPM GENERATOR CHANGEOUT N/A 01/14/2019   Procedure: PPM GENERATOR CHANGEOUT;  Surgeon: Terry Haw, MD;  Location: [place] [facility];   Service: Cardiovascular;  Laterality: N/A;    Current Medications: Current Meds  Medication Sig  . Cholecalciferol (VITAMIN D) 50 MCG (2000 UT) CAPS Take 2,000 Units by mouth daily.  Marland Kitchen EPINEPHrine (EPIPEN JR) 0.15 MG/0.3ML injection Inject 0.15 mg into the muscle as needed for anaphylaxis.   Marland Kitchen FIASP FLEXTOUCH 100 UNIT/ML SOPN Take 20 Units by mouth at bedtime.   . insulin degludec (TRESIBA) 100 UNIT/ML SOPN FlexTouch Pen Inject 36 Units into the skin daily.   . Omega-3 Fatty Acids (FISH OIL OMEGA-3 PO) Take 1 capsule by mouth daily.     Allergies:   Penicillins   Social History   Socioeconomic History  . Marital status: Single    Spouse name: Not on file  . Number of children: Not on file  . Years of education: Not on file  . Highest education level: Not on file  Occupational History  . Not on file  Social Needs  . Financial resource strain: Not on file  . Food insecurity    Worry: Not on file    Inability: Not on file  . Transportation needs    Medical: Not on file    Non-medical: Not on file  Tobacco Use  . Smoking status: Never Smoker  . Smokeless tobacco: Never Used  Substance and Sexual Activity  . Alcohol use: Yes  . Drug use: Not Currently  . Sexual activity: Not on file  Lifestyle  . Physical activity    Days per week: Not on file    Minutes per session:  Not on file  . Stress: Not on file  Relationships  . Social Musician on phone: Not on file    Gets together: Not on file    Attends religious service: Not on file    Active member of club or organization: Not on file    Attends meetings of clubs or organizations: Not on file    Relationship status: Not on file  Other Topics Concern  . Not on file  Social History Narrative  . Not on file     Family History: The patient's family history includes Colon cancer in his father; Diabetes in his mother. ROS:   Please see the history of present illness.    All 14 point review of systems  negative except as described per history of present illness  EKGs/Labs/Other Studies Reviewed:      Recent Labs: 12/22/2018: BUN 14; Creatinine, Ser 1.18; Hemoglobin 15.1; Platelets 203; Potassium 4.7; Sodium 140  Recent Lipid Panel No results found for: CHOL, TRIG, HDL, CHOLHDL, VLDL, LDLCALC, LDLDIRECT  Physical Exam:    VS:  BP 114/68   Pulse 62   Ht 5\' 3"  (1.6 m)   Wt 169 lb (76.7 kg)   SpO2 98%   BMI 29.94 kg/m     Wt Readings from Last 3 Encounters:  09/09/19 169 lb (76.7 kg)  01/14/19 161 lb (73 kg)  12/22/18 167 lb (75.8 kg)     GEN:  Well nourished, well developed in no acute distress HEENT: Normal NECK: No JVD; No carotid bruits LYMPHATICS: No lymphadenopathy CARDIAC: RRR, no murmurs, no rubs, no gallops RESPIRATORY:  Clear to auscultation without rales, wheezing or rhonchi  ABDOMEN: Soft, non-tender, non-distended MUSCULOSKELETAL:  No edema; No deformity  SKIN: Warm and dry LOWER EXTREMITIES: no swelling NEUROLOGIC:  Alert and oriented x 3 PSYCHIATRIC:  Normal affect   ASSESSMENT:    1. Pacemaker   2. Bradycardia   3. Poorly controlled diabetes mellitus (HCC)    PLAN:    In order of problems listed above:  1. Pacemaker present and recent replacement doing well functioning normally. 2. Dyspnea on exertion: We will schedule him to have an echocardiogram to assess left ventricle ejection fraction.  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 3. Poorly controlled diabetes doing better from that point review thank you to his primary care physician. 4. Dyslipidemia will call primary care physician to get his fasting lipid profile.   Medication Adjustments/Labs and Tests Ordered: Current medicines are reviewed at length with the patient today.  Concerns regarding medicines are outlined above.  No orders of the defined types were placed in this encounter.  Medication changes: No orders of the defined types were placed in this  encounter.   Signed, 02/21/19, MD, [facility] 09/09/2019 8:46 AM    Terry Joyce

## 2019-09-09 NOTE — Patient Instructions (Signed)
Medication Instructions:  Your physician recommends that you continue on your current medications as directed. Please refer to the Current Medication list given to you today.  *If you need a refill on your cardiac medications before your next appointment, please call your pharmacy*  Lab Work: None.  If you have labs (blood work) drawn today and your tests are completely normal, you will receive your results only by: . MyChart Message (if you have MyChart) OR . A paper copy in the mail If you have any lab test that is abnormal or we need to change your treatment, we will call you to review the results.  Testing/Procedures: Your physician has requested that you have an echocardiogram. Echocardiography is a painless test that uses sound waves to create images of your heart. It provides your doctor with information about the size and shape of your heart and how well your heart's chambers and valves are working. This procedure takes approximately one hour. There are no restrictions for this procedure.    Follow-Up: At [facility], you and your health needs are our priority.  As part of our continuing mission to provide you with exceptional heart care, we have created designated Provider Care Teams.  These Care Teams include your primary Cardiologist (physician) and Advanced Practice Providers (APPs -  Physician Assistants and Nurse Practitioners) who all work together to provide you with the care you need, when you need it.  Your next appointment:   5 months  The format for your next appointment:   In Person  Provider:   Robert Krasowski, MD  Other Instructions   Echocardiogram An echocardiogram is a procedure that uses painless sound waves (ultrasound) to produce an image of the heart. Images from an echocardiogram can provide important information about:  Signs of coronary artery disease (CAD).  Aneurysm detection. An aneurysm is a weak or damaged part of an artery wall that  bulges out from the normal force of blood pumping through the body.  Heart size and shape. Changes in the size or shape of the heart can be associated with certain conditions, including heart failure, aneurysm, and CAD.  Heart muscle function.  Heart valve function.  Signs of a past heart attack.  Fluid buildup around the heart.  Thickening of the heart muscle.  A tumor or infectious growth around the heart valves. Tell a health care provider about:  Any allergies you have.  All medicines you are taking, including vitamins, herbs, eye drops, creams, and over-the-counter medicines.  Any blood disorders you have.  Any surgeries you have had.  Any medical conditions you have.  Whether you are pregnant or may be pregnant. What are the risks? Generally, this is a safe procedure. However, problems may occur, including:  Allergic reaction to dye (contrast) that may be used during the procedure. What happens before the procedure? No specific preparation is needed. You may eat and drink normally. What happens during the procedure?   An IV tube may be inserted into one of your veins.  You may receive contrast through this tube. A contrast is an injection that improves the quality of the pictures from your heart.  A gel will be applied to your chest.  A wand-like tool (transducer) will be moved over your chest. The gel will help to transmit the sound waves from the transducer.  The sound waves will harmlessly bounce off of your heart to allow the heart images to be captured in real-time motion. The images will be recorded   on a computer. The procedure may vary among health care providers and hospitals. What happens after the procedure?  You may return to your normal, everyday life, including diet, activities, and medicines, unless your health care provider tells you not to do that. Summary  An echocardiogram is a procedure that uses painless sound waves (ultrasound) to produce  an image of the heart.  Images from an echocardiogram can provide important information about the size and shape of your heart, heart muscle function, heart valve function, and fluid buildup around your heart.  You do not need to do anything to prepare before this procedure. You may eat and drink normally.  After the echocardiogram is completed, you may return to your normal, everyday life, unless your health care provider tells you not to do that. This information is not intended to replace advice given to you by your health care provider. Make sure you discuss any questions you have with your health care provider. Document Released: 10/04/2000 Document Revised: 01/28/2019 Document Reviewed: 11/09/2016 Elsevier Patient Education  2020 Elsevier Inc.   

## 2019-09-20 ENCOUNTER — Ambulatory Visit (INDEPENDENT_AMBULATORY_CARE_PROVIDER_SITE_OTHER): Payer: 59 | Admitting: *Deleted

## 2019-09-20 DIAGNOSIS — R001 Bradycardia, unspecified: Secondary | ICD-10-CM

## 2019-09-20 LAB — CUP PACEART REMOTE DEVICE CHECK
Battery Remaining Longevity: 74 mo
Battery Remaining Percentage: 95.5 %
Battery Voltage: 3.01 V
Brady Statistic AP VP Percent: 1.7 %
Brady Statistic AP VS Percent: 94 %
Brady Statistic AS VP Percent: 1 %
Brady Statistic AS VS Percent: 4.4 %
Brady Statistic RA Percent Paced: 94 %
Brady Statistic RV Percent Paced: 1.7 %
Date Time Interrogation Session: 20201130020012
Implantable Lead Implant Date: 20090402
Implantable Lead Implant Date: 20200326
Implantable Lead Location: 753859
Implantable Lead Location: 753860
Implantable Pulse Generator Implant Date: 20200326
Lead Channel Impedance Value: 460 Ohm
Lead Channel Impedance Value: 510 Ohm
Lead Channel Pacing Threshold Amplitude: 0.5 V
Lead Channel Pacing Threshold Amplitude: 1.5 V
Lead Channel Pacing Threshold Pulse Width: 0.5 ms
Lead Channel Pacing Threshold Pulse Width: 0.5 ms
Lead Channel Sensing Intrinsic Amplitude: 2.2 mV
Lead Channel Sensing Intrinsic Amplitude: 5.4 mV
Lead Channel Setting Pacing Amplitude: 2.5 V
Lead Channel Setting Pacing Amplitude: 3.5 V
Lead Channel Setting Pacing Pulse Width: 0.5 ms
Lead Channel Setting Sensing Sensitivity: 0.7 mV
Pulse Gen Model: 2272
Pulse Gen Serial Number: 9120360

## 2019-10-13 ENCOUNTER — Other Ambulatory Visit: Payer: Self-pay

## 2019-10-13 NOTE — Progress Notes (Signed)
PPM remote 

## 2019-10-26 ENCOUNTER — Encounter: Payer: Self-pay | Admitting: Cardiology

## 2019-11-05 ENCOUNTER — Other Ambulatory Visit: Payer: Self-pay

## 2019-11-05 ENCOUNTER — Ambulatory Visit (INDEPENDENT_AMBULATORY_CARE_PROVIDER_SITE_OTHER): Payer: 59

## 2019-11-05 DIAGNOSIS — R0600 Dyspnea, unspecified: Secondary | ICD-10-CM

## 2019-11-05 NOTE — Progress Notes (Signed)
Complete echocardiogram has been peen performed.  Jimmy Blannie Shedlock RDCS, RVT 

## 2019-12-14 DIAGNOSIS — K429 Umbilical hernia without obstruction or gangrene: Secondary | ICD-10-CM | POA: Insufficient documentation

## 2019-12-14 HISTORY — DX: Umbilical hernia without obstruction or gangrene: K42.9

## 2019-12-20 ENCOUNTER — Ambulatory Visit (INDEPENDENT_AMBULATORY_CARE_PROVIDER_SITE_OTHER): Payer: 59 | Admitting: *Deleted

## 2019-12-20 DIAGNOSIS — R001 Bradycardia, unspecified: Secondary | ICD-10-CM

## 2019-12-22 LAB — CUP PACEART REMOTE DEVICE CHECK
Battery Remaining Longevity: 77 mo
Battery Remaining Percentage: 95.5 %
Battery Voltage: 2.99 V
Brady Statistic AP VP Percent: 1.7 %
Brady Statistic AP VS Percent: 95 %
Brady Statistic AS VP Percent: 1 %
Brady Statistic AS VS Percent: 3.4 %
Brady Statistic RA Percent Paced: 95 %
Brady Statistic RV Percent Paced: 1.8 %
Date Time Interrogation Session: 20210303002556
Implantable Lead Implant Date: 20090402
Implantable Lead Implant Date: 20200326
Implantable Lead Location: 753859
Implantable Lead Location: 753860
Implantable Pulse Generator Implant Date: 20200326
Lead Channel Impedance Value: 460 Ohm
Lead Channel Impedance Value: 480 Ohm
Lead Channel Pacing Threshold Amplitude: 0.5 V
Lead Channel Pacing Threshold Amplitude: 1.5 V
Lead Channel Pacing Threshold Pulse Width: 0.5 ms
Lead Channel Pacing Threshold Pulse Width: 0.5 ms
Lead Channel Sensing Intrinsic Amplitude: 3.4 mV
Lead Channel Sensing Intrinsic Amplitude: 4.8 mV
Lead Channel Setting Pacing Amplitude: 2.5 V
Lead Channel Setting Pacing Amplitude: 3.5 V
Lead Channel Setting Pacing Pulse Width: 0.5 ms
Lead Channel Setting Sensing Sensitivity: 0.7 mV
Pulse Gen Model: 2272
Pulse Gen Serial Number: 9120360

## 2019-12-22 NOTE — Progress Notes (Signed)
PPM Remote  

## 2020-03-22 ENCOUNTER — Ambulatory Visit (INDEPENDENT_AMBULATORY_CARE_PROVIDER_SITE_OTHER): Payer: 59 | Admitting: *Deleted

## 2020-03-22 DIAGNOSIS — I495 Sick sinus syndrome: Secondary | ICD-10-CM

## 2020-03-22 LAB — CUP PACEART REMOTE DEVICE CHECK
Battery Remaining Longevity: 79 mo
Battery Remaining Percentage: 95.5 %
Battery Voltage: 2.99 V
Brady Statistic AP VP Percent: 1.8 %
Brady Statistic AP VS Percent: 95 %
Brady Statistic AS VP Percent: 1 %
Brady Statistic AS VS Percent: 3.4 %
Brady Statistic RA Percent Paced: 95 %
Brady Statistic RV Percent Paced: 1.8 %
Date Time Interrogation Session: 20210531021208
Implantable Lead Implant Date: 20090402
Implantable Lead Implant Date: 20200326
Implantable Lead Location: 753859
Implantable Lead Location: 753860
Implantable Pulse Generator Implant Date: 20200326
Lead Channel Impedance Value: 460 Ohm
Lead Channel Impedance Value: 530 Ohm
Lead Channel Pacing Threshold Amplitude: 0.5 V
Lead Channel Pacing Threshold Amplitude: 1.5 V
Lead Channel Pacing Threshold Pulse Width: 0.5 ms
Lead Channel Pacing Threshold Pulse Width: 0.5 ms
Lead Channel Sensing Intrinsic Amplitude: 3 mV
Lead Channel Sensing Intrinsic Amplitude: 4.9 mV
Lead Channel Setting Pacing Amplitude: 2.5 V
Lead Channel Setting Pacing Amplitude: 3.5 V
Lead Channel Setting Pacing Pulse Width: 0.5 ms
Lead Channel Setting Sensing Sensitivity: 0.7 mV
Pulse Gen Model: 2272
Pulse Gen Serial Number: 9120360

## 2020-03-27 NOTE — Progress Notes (Signed)
Remote pacemaker transmission.   

## 2020-04-23 IMAGING — CR CHEST - 2 VIEW
2 series · 2 of 2 positions shown · non-contrast
Comparison: 01/14/2019

CLINICAL DATA: Cough

EXAM:
CHEST - 2 VIEW

[w chest pa]
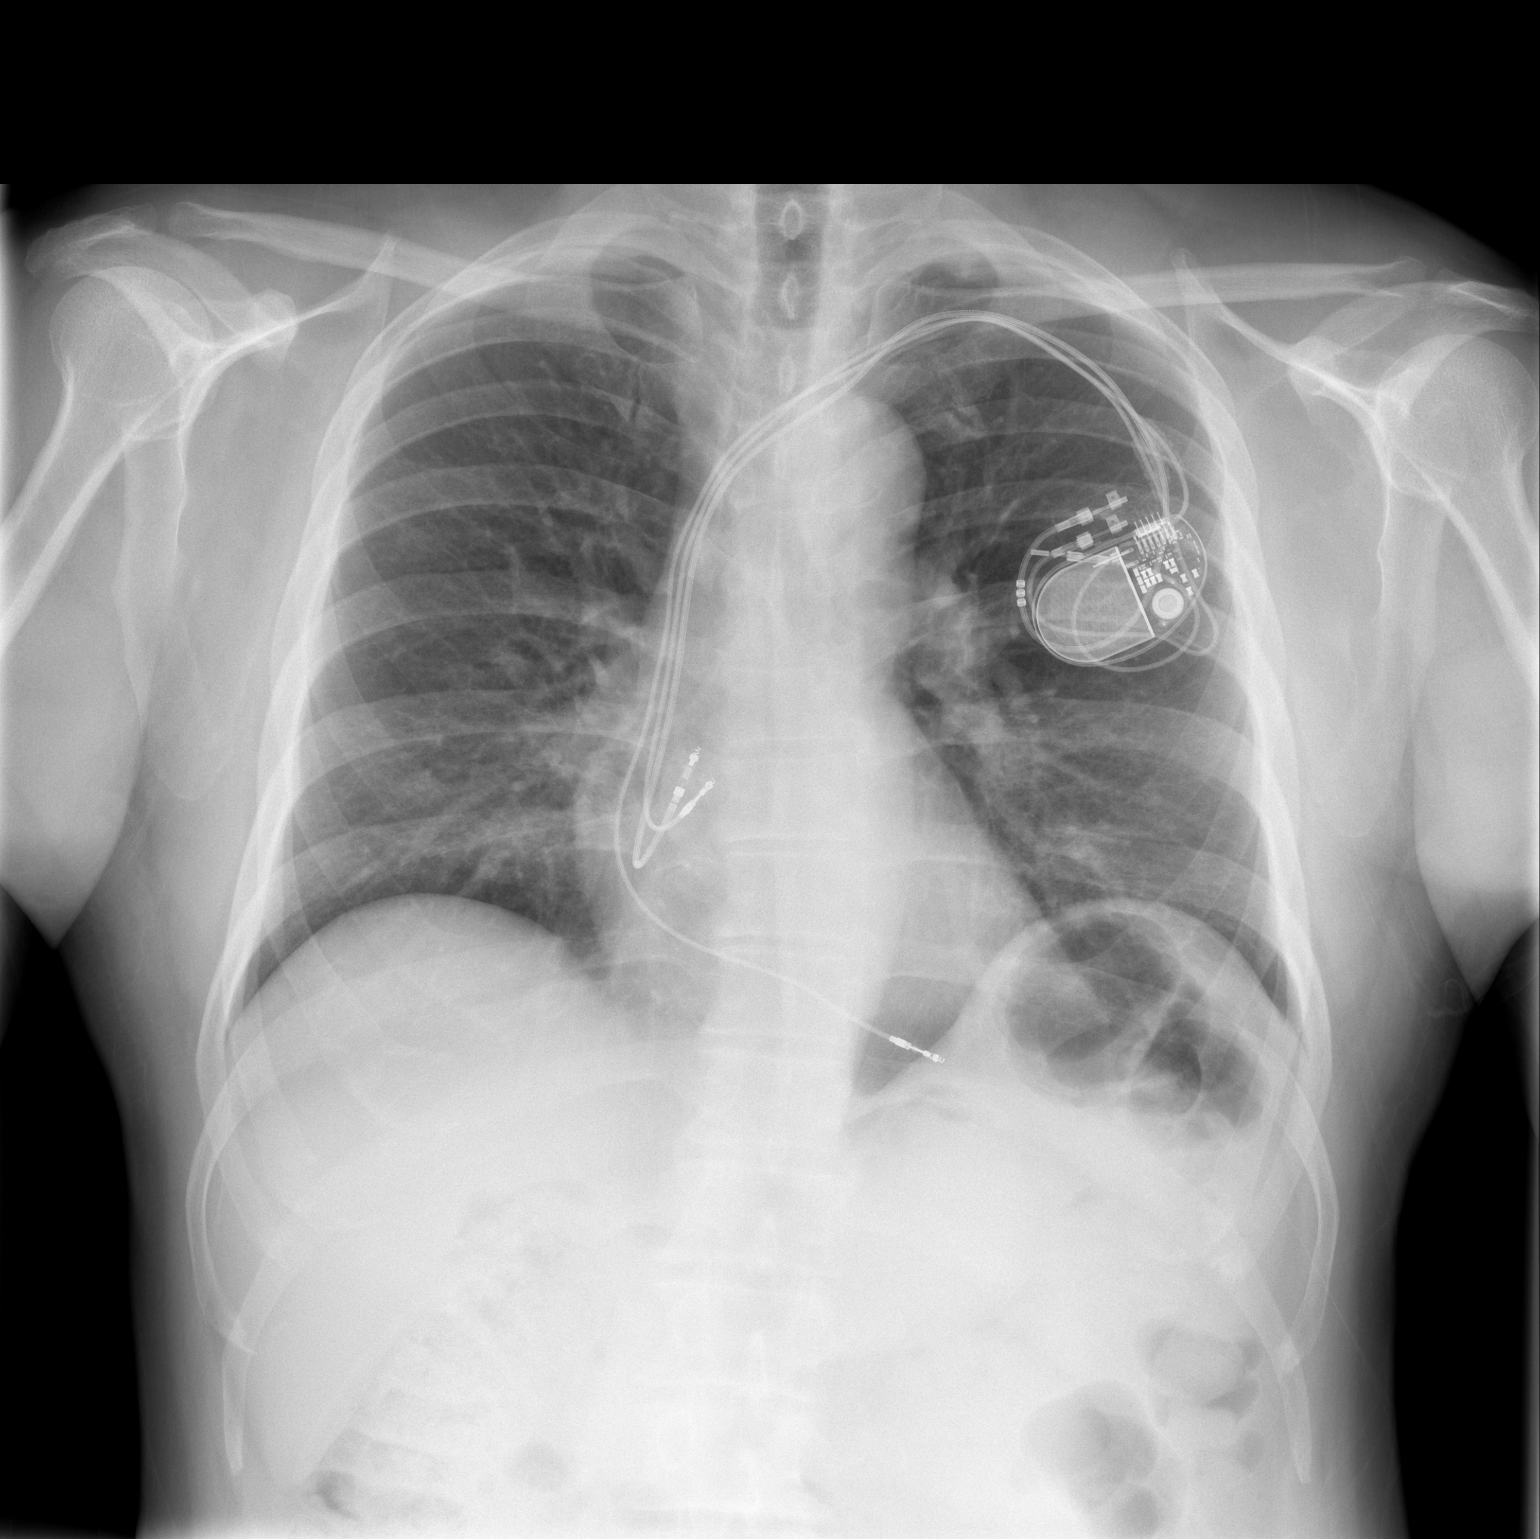

[w chest lat]
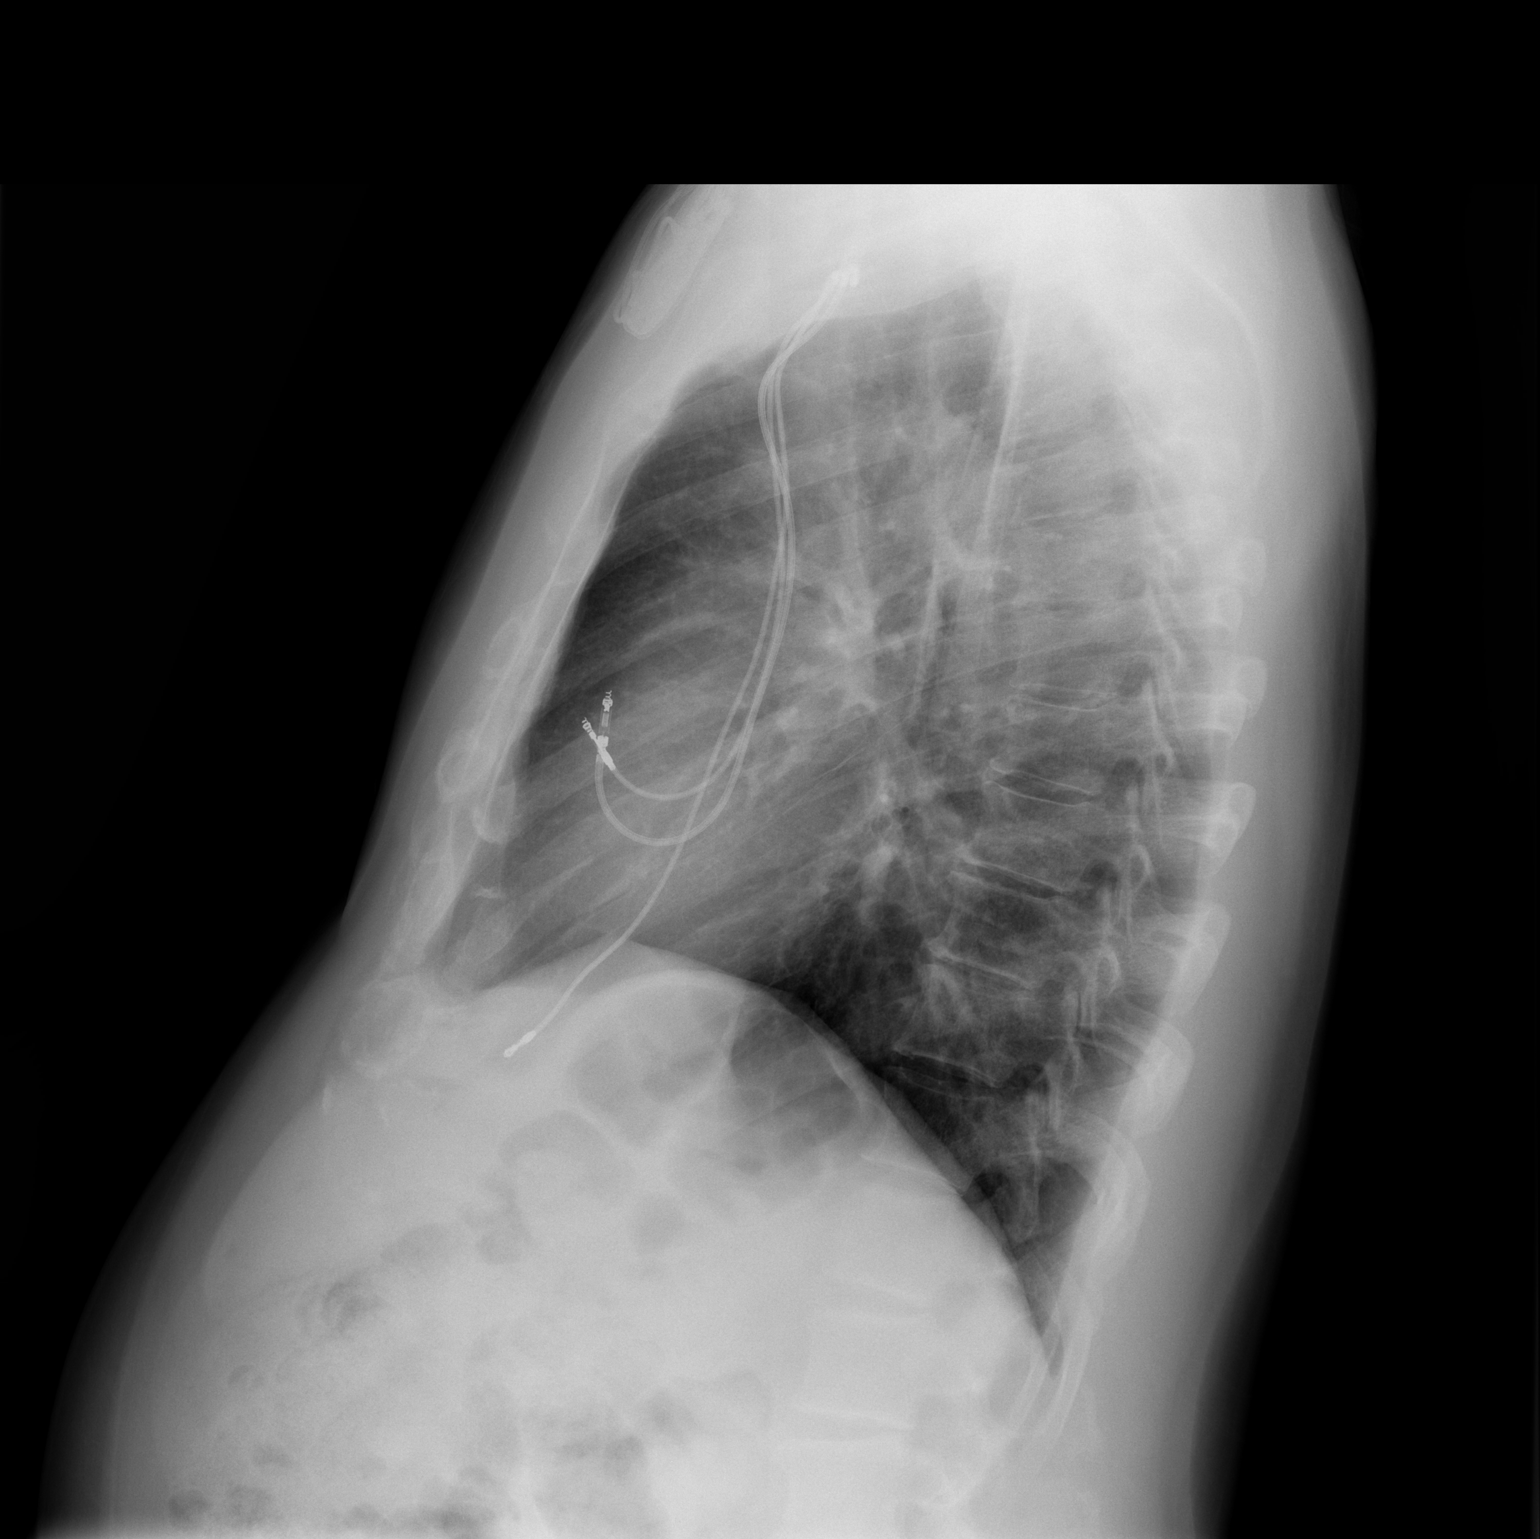

[2 of 2 positions shown; findings below may reference images not displayed]

FINDINGS: Left subclavian sequential transvenous pacemaker leads project at
right atrium and right ventricle.

Normal heart size, mediastinal contours, and pulmonary vascularity.

Eventration LEFT diaphragm unchanged.

Lungs clear.

No infiltrate, pleural effusion, or pneumothorax.

Bones unremarkable.
IMPRESSION: No acute abnormalities.

## 2020-06-21 ENCOUNTER — Ambulatory Visit (INDEPENDENT_AMBULATORY_CARE_PROVIDER_SITE_OTHER): Payer: 59 | Admitting: *Deleted

## 2020-06-21 DIAGNOSIS — R001 Bradycardia, unspecified: Secondary | ICD-10-CM

## 2020-06-22 LAB — CUP PACEART REMOTE DEVICE CHECK
Battery Remaining Longevity: 77 mo
Battery Remaining Percentage: 95.5 %
Battery Voltage: 2.99 V
Brady Statistic AP VP Percent: 1.8 %
Brady Statistic AP VS Percent: 95 %
Brady Statistic AS VP Percent: 1 %
Brady Statistic AS VS Percent: 3.1 %
Brady Statistic RA Percent Paced: 95 %
Brady Statistic RV Percent Paced: 1.8 %
Date Time Interrogation Session: 20210901020012
Implantable Lead Implant Date: 20090402
Implantable Lead Implant Date: 20200326
Implantable Lead Location: 753859
Implantable Lead Location: 753860
Implantable Pulse Generator Implant Date: 20200326
Lead Channel Impedance Value: 450 Ohm
Lead Channel Impedance Value: 490 Ohm
Lead Channel Pacing Threshold Amplitude: 0.5 V
Lead Channel Pacing Threshold Amplitude: 1.5 V
Lead Channel Pacing Threshold Pulse Width: 0.5 ms
Lead Channel Pacing Threshold Pulse Width: 0.5 ms
Lead Channel Sensing Intrinsic Amplitude: 3.9 mV
Lead Channel Sensing Intrinsic Amplitude: 4.5 mV
Lead Channel Setting Pacing Amplitude: 2.5 V
Lead Channel Setting Pacing Amplitude: 3.5 V
Lead Channel Setting Pacing Pulse Width: 0.5 ms
Lead Channel Setting Sensing Sensitivity: 0.7 mV
Pulse Gen Model: 2272
Pulse Gen Serial Number: 9120360

## 2020-06-23 NOTE — Progress Notes (Signed)
Remote pacemaker transmission.   

## 2020-09-20 ENCOUNTER — Ambulatory Visit (INDEPENDENT_AMBULATORY_CARE_PROVIDER_SITE_OTHER): Payer: 59

## 2020-09-20 DIAGNOSIS — I495 Sick sinus syndrome: Secondary | ICD-10-CM

## 2020-09-20 LAB — CUP PACEART REMOTE DEVICE CHECK
Battery Remaining Longevity: 79 mo
Battery Remaining Percentage: 95.5 %
Battery Voltage: 2.99 V
Brady Statistic AP VP Percent: 1.9 %
Brady Statistic AP VS Percent: 95 %
Brady Statistic AS VP Percent: 1 %
Brady Statistic AS VS Percent: 2.8 %
Brady Statistic RA Percent Paced: 95 %
Brady Statistic RV Percent Paced: 1.9 %
Date Time Interrogation Session: 20211201043728
Implantable Lead Implant Date: 20090402
Implantable Lead Implant Date: 20200326
Implantable Lead Location: 753859
Implantable Lead Location: 753860
Implantable Pulse Generator Implant Date: 20200326
Lead Channel Impedance Value: 480 Ohm
Lead Channel Impedance Value: 510 Ohm
Lead Channel Pacing Threshold Amplitude: 0.5 V
Lead Channel Pacing Threshold Amplitude: 1.5 V
Lead Channel Pacing Threshold Pulse Width: 0.5 ms
Lead Channel Pacing Threshold Pulse Width: 0.5 ms
Lead Channel Sensing Intrinsic Amplitude: 4.3 mV
Lead Channel Sensing Intrinsic Amplitude: 6.4 mV
Lead Channel Setting Pacing Amplitude: 2.5 V
Lead Channel Setting Pacing Amplitude: 3.5 V
Lead Channel Setting Pacing Pulse Width: 0.5 ms
Lead Channel Setting Sensing Sensitivity: 0.7 mV
Pulse Gen Model: 2272
Pulse Gen Serial Number: 9120360

## 2020-09-27 NOTE — Progress Notes (Signed)
Remote pacemaker transmission.   

## 2020-12-11 ENCOUNTER — Other Ambulatory Visit: Payer: Self-pay

## 2020-12-20 ENCOUNTER — Other Ambulatory Visit: Payer: Self-pay

## 2020-12-20 ENCOUNTER — Encounter: Payer: Self-pay | Admitting: Cardiology

## 2020-12-20 ENCOUNTER — Ambulatory Visit (INDEPENDENT_AMBULATORY_CARE_PROVIDER_SITE_OTHER): Payer: 59

## 2020-12-20 ENCOUNTER — Ambulatory Visit: Payer: 59 | Admitting: Cardiology

## 2020-12-20 VITALS — BP 114/62 | HR 60 | Ht 65.0 in | Wt 158.0 lb

## 2020-12-20 DIAGNOSIS — Z950 Presence of cardiac pacemaker: Secondary | ICD-10-CM

## 2020-12-20 DIAGNOSIS — R0609 Other forms of dyspnea: Secondary | ICD-10-CM

## 2020-12-20 DIAGNOSIS — I2510 Atherosclerotic heart disease of native coronary artery without angina pectoris: Secondary | ICD-10-CM

## 2020-12-20 DIAGNOSIS — I495 Sick sinus syndrome: Secondary | ICD-10-CM | POA: Diagnosis not present

## 2020-12-20 DIAGNOSIS — R0600 Dyspnea, unspecified: Secondary | ICD-10-CM | POA: Insufficient documentation

## 2020-12-20 DIAGNOSIS — E785 Hyperlipidemia, unspecified: Secondary | ICD-10-CM

## 2020-12-20 DIAGNOSIS — E1165 Type 2 diabetes mellitus with hyperglycemia: Secondary | ICD-10-CM

## 2020-12-20 HISTORY — DX: Other forms of dyspnea: R06.09

## 2020-12-20 HISTORY — DX: Dyspnea, unspecified: R06.00

## 2020-12-20 NOTE — Progress Notes (Signed)
Cardiology Office Note:    Date:  12/20/2020   ID:  Terry Joyce, DOB 03/14/1951, MRN [id number]  PCP:  Ralene Ok, MD  Cardiologist:  Gypsy Balsam, MD    Referring MD: Ralene Ok, MD   No chief complaint on file. Am having dizziness  History of Present Illness:    Terry Joyce is a 70 y.o. 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  He said he got vertigo he said anytime he moves his head room will spin around.  This is a chronic problem but usually goes away within a few days this episode lasted a bit longer.  Yesterday in the matter-of-fact he had to throw up when he had those episodes.  He have already spoken to his primary care physician and some medication being sent.  He did not try dose yet.  Another concern he have about 2 weeks ago he was working the garden he was trimming bushes and after that he developed pain in the left shoulder moving trending positive the shoulder did not make much difference he described pain as 8 scale up to 10.  That sensation lasted few days however eventually ended up going away.  Today he does not have any pain.  Also described to have a little more shortness of breath than before.  Doing things well.  Shortness of breath she is always trying to be active in spite of that he developed exertional shortness of breath.  Past Medical History:  Diagnosis Date  . Atypical chest pain 12/10/2018  . 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 Coronary artery disease involving native coronary artery of native heart without angina pectoris 07/13/2015   Overview:  Luminal disease by cardiac catheter from September 2016  . Dyslipidemia 06/29/2015  . Exertional chest pain 06/29/2015  . Normal coronary arteries 04/30/2018  . Pacemaker 12/10/2018  . Pacemaker  reprogramming/check 06/29/2015  . Poorly controlled diabetes mellitus (HCC) 06/29/2015  . Pre-syncope 10/26/2017  . Tachycardia 10/26/2017  . Umbilical hernia without obstruction and without gangrene 12/14/2019    Past Surgical History:  Procedure Laterality Date  . CARDIAC CATHETERIZATION Left 07/04/2015   Surgeon: Derrek Gu, MD; Location: [facility] CATH; Service: Cardiology  . CARDIAC PACEMAKER PLACEMENT    . LEAD INSERTION Left 01/14/2019   Procedure: LEAD INSERTION;  Surgeon: Regan Lemming, MD;  Location: [facility];  Service: Cardiovascular;  Laterality: Left;  . PPM GENERATOR CHANGEOUT N/A 01/14/2019   Procedure: PPM GENERATOR CHANGEOUT;  Surgeon: Regan Lemming, MD;  Location: [facility];  Service: Cardiovascular;  Laterality: N/A;    Current Medications: Current Meds  Medication Sig  . EPINEPHrine (EPIPEN JR) 0.15 MG/0.3ML injection Inject 0.15 mg into the muscle as needed for anaphylaxis.   Marland Kitchen FIASP FLEXTOUCH 100 UNIT/ML SOPN Take 8-10 Units by mouth at bedtime.  . insulin degludec (TRESIBA) 100 UNIT/ML SOPN FlexTouch Pen Inject 45 Units into the skin daily.  . meclizine (ANTIVERT) 25 MG tablet Take 25 mg by mouth 3 (three) times daily as needed. diverticulosis  . ondansetron (ZOFRAN) 8 MG tablet Take 1 tablet by mouth as needed. Nausea  . Semaglutide (OZEMPIC, 0.25 OR 0.5 MG/DOSE, [place]) Inject 0.25 mg into the skin once a week.  . tamsulosin (FLOMAX) 0.4 MG CAPS capsule Take 1 capsule by mouth daily as needed (prostate).  Allergies:   Penicillins   Social History   Socioeconomic History  . Marital status: Single    Spouse name: Not on file  . Number of children: Not on file  . Years of education: Not on file  . Highest education level: Not on file  Occupational History  . Not on file  Tobacco Use  . Smoking status: Never Smoker  . Smokeless tobacco: Never Used  Vaping Use  . Vaping Use: Never used  Substance and Sexual Activity  .  Alcohol use: Yes  . Drug use: Not Currently  . Sexual activity: Not on file  Other Topics Concern  . Not on file  Social History Narrative  . Not on file   Social Determinants of Health   Financial Resource Strain: Not on file  Food Insecurity: Not on file  Transportation Needs: Not on file  Physical Activity: Not on file  Stress: Not on file  Social Connections: Not on file     Family History: The patient's family history includes Colon cancer in his father; Diabetes in his mother. ROS:   Please see the history of present illness.    All 14 point review of systems negative except as described per history of present illness  EKGs/Labs/Other Studies Reviewed:      Recent Labs: No results found for requested labs within last 8760 hours.  Recent Lipid Panel No results found for: CHOL, TRIG, HDL, CHOLHDL, VLDL, LDLCALC, LDLDIRECT  Physical Exam:    VS:  BP 114/62 (BP Location: Right Arm, Patient Position: Sitting)   Pulse 60   Ht 5\' 5"  (1.651 m)   Wt 158 lb (71.7 kg)   SpO2 98%   BMI 26.29 kg/m     Wt Readings from Last 3 Encounters:  12/20/20 158 lb (71.7 kg)  09/09/19 169 lb (76.7 kg)  01/14/19 161 lb (73 kg)     GEN:  Well nourished, well developed in no acute distress HEENT: Normal NECK: No JVD; No carotid bruits LYMPHATICS: No lymphadenopathy CARDIAC: RRR, no murmurs, no rubs, no gallops RESPIRATORY:  Clear to auscultation without rales, wheezing or rhonchi  ABDOMEN: Soft, non-tender, non-distended MUSCULOSKELETAL:  No edema; No deformity  SKIN: Warm and dry LOWER EXTREMITIES: no swelling NEUROLOGIC:  Alert and oriented x 3 PSYCHIATRIC:  Normal affect   ASSESSMENT:    1. Dyspnea on exertion   2. Coronary artery disease involving native coronary artery of native heart without angina pectoris   3. Poorly controlled diabetes mellitus (HCC)   4. Pacemaker   5. Dyslipidemia    PLAN:    In order of problems listed above:  1. Dyspnea on exertion  obviously concerning.  I will ask him to have proBNP today.  We will do echocardiogram to assess left ventricle ejection fraction. 2. Coronary disease with only luminal disease based on cardiac catheterization from 2016.  Problem is that he still got multiple risk factors that are not modified.  For example he is not taking aspirin.  I asked him that he must take aspirin every single day at least baby aspirin he agree.  He does not want to take any cholesterol medication.  I did spend great left time talking to him trying to convince him that this is important but he still does not want to. 3. Diabetes mellitus again poorly controlled.  I will call primary care physician to get his fasting lipid profile as well as hemoglobin A1c. 4. Pacemaker present I did review interrogation battery status  is normal.  I did review multiple mode switch that he did have however mode switch happen only until May.  I do not see exactly documentation of what was changed I suspect programming has been changed I do not have any rhythm strips from episode of atrial fibrillation, therefore I do not think he does have episode of atrial fibrillation.  We will continue present management. 5. Dyslipidemia problematic he does not want to take any medications for it again will call primary care physician to get his fasting lipid profile.  For today I will check his proBNP, troponin I as well as Chem-7.  Echocardiogram will be done I see him back in 3 months   Medication Adjustments/Labs and Tests Ordered: Current medicines are reviewed at length with the patient today.  Concerns regarding medicines are outlined above.  Orders Placed This Encounter  Procedures  . Troponin T  . Pro b natriuretic peptide (BNP)  . Basic metabolic panel  . EKG 12-Lead  . ECHOCARDIOGRAM COMPLETE   Medication changes: No orders of the defined types were placed in this encounter.   Signed, Georgeanna Lea, MD, [facility] 12/20/2020 11:17 AM    [facility]

## 2020-12-20 NOTE — Patient Instructions (Signed)
Medication Instructions:  Your physician recommends that you continue on your current medications as directed. Please refer to the Current Medication list given to you today. *If you need a refill on your cardiac medications before your next appointment, please call your pharmacy*   Lab Work: Your physician recommends that you return for lab work today: bmp, pro bnp, troponin  If you have labs (blood work) drawn today and your tests are completely normal, you will receive your results only by: Marland Kitchen MyChart Message (if you have MyChart) OR . A paper copy in the mail If you have any lab test that is abnormal or we need to change your treatment, we will call you to review the results.   Testing/Procedures: Your physician has requested that you have an echocardiogram. Echocardiography is a painless test that uses sound waves to create images of your heart. It provides your doctor with information about the size and shape of your heart and how well your heart's chambers and valves are working. This procedure takes approximately one hour. There are no restrictions for this procedure.     Follow-Up: At [facility], you and your health needs are our priority.  As part of our continuing mission to provide you with exceptional heart care, we have created designated Provider Care Teams.  These Care Teams include your primary Cardiologist (physician) and Advanced Practice Providers (APPs -  Physician Assistants and Nurse Practitioners) who all work together to provide you with the care you need, when you need it.  We recommend signing up for the patient portal called "MyChart".  Sign up information is provided on this After Visit Summary.  MyChart is used to connect with patients for Virtual Visits (Telemedicine).  Patients are able to view lab/test results, encounter notes, upcoming appointments, etc.  Non-urgent messages can be sent to your provider as well.   To learn more about what you can do with  MyChart, go to [email or web address].    Your next appointment:   3 month(s)  The format for your next appointment:   In Person  Provider:   Gypsy Balsam, MD   Other Instructions   Echocardiogram An echocardiogram is a test that uses sound waves (ultrasound) to produce images of the heart. Images from an echocardiogram can provide important information about:  Heart size and shape.  The size and thickness and movement of your heart's walls.  Heart muscle function and strength.  Heart valve function or if you have stenosis. Stenosis is when the heart valves are too narrow.  If blood is flowing backward through the heart valves (regurgitation).  A tumor or infectious growth around the heart valves.  Areas of heart muscle that are not working well because of poor blood flow or injury from a heart attack.  Aneurysm detection. An aneurysm is a weak or damaged part of an artery wall. The wall bulges out from the normal force of blood pumping through the body. Tell a health care provider about:  Any allergies you have.  All medicines you are taking, including vitamins, herbs, eye drops, creams, and over-the-counter medicines.  Any blood disorders you have.  Any surgeries you have had.  Any medical conditions you have.  Whether you are pregnant or may be pregnant. What are the risks? Generally, this is a safe test. However, problems may occur, including an allergic reaction to dye (contrast) that may be used during the test. What happens before the test? No specific preparation is needed. You may eat and  drink normally. What happens during the test?  You will take off your clothes from the waist up and put on a hospital gown.  Electrodes or electrocardiogram (ECG)patches may be placed on your chest. The electrodes or patches are then connected to a device that monitors your heart rate and rhythm.  You will lie down on a table for an ultrasound exam. A gel will  be applied to your chest to help sound waves pass through your skin.  A handheld device, called a transducer, will be pressed against your chest and moved over your heart. The transducer produces sound waves that travel to your heart and bounce back (or "echo" back) to the transducer. These sound waves will be captured in real-time and changed into images of your heart that can be viewed on a video monitor. The images will be recorded on a computer and reviewed by your health care provider.  You may be asked to change positions or hold your breath for a short time. This makes it easier to get different views or better views of your heart.  In some cases, you may receive contrast through an IV in one of your veins. This can improve the quality of the pictures from your heart. The procedure may vary among health care providers and hospitals.   What can I expect after the test? You may return to your normal, everyday life, including diet, activities, and medicines, unless your health care provider tells you not to do that. Follow these instructions at home:  It is up to you to get the results of your test. Ask your health care provider, or the department that is doing the test, when your results will be ready.  Keep all follow-up visits. This is important. Summary  An echocardiogram is a test that uses sound waves (ultrasound) to produce images of the heart.  Images from an echocardiogram can provide important information about the size and shape of your heart, heart muscle function, heart valve function, and other possible heart problems.  You do not need to do anything to prepare before this test. You may eat and drink normally.  After the echocardiogram is completed, you may return to your normal, everyday life, unless your health care provider tells you not to do that. This information is not intended to replace advice given to you by your health care provider. Make sure you discuss any  questions you have with your health care provider. Document Revised: 05/30/2020 Document Reviewed: 05/30/2020 Elsevier Patient Education  2021 [employer].

## 2020-12-21 ENCOUNTER — Telehealth: Payer: Self-pay

## 2020-12-21 ENCOUNTER — Ambulatory Visit: Payer: 59 | Admitting: Cardiology

## 2020-12-21 LAB — BASIC METABOLIC PANEL
BUN/Creatinine Ratio: 11 (ref 10–24)
BUN: 14 mg/dL (ref 8–27)
CO2: 22 mmol/L (ref 20–29)
Calcium: 8.9 mg/dL (ref 8.6–10.2)
Chloride: 101 mmol/L (ref 96–106)
Creatinine, Ser: 1.26 mg/dL (ref 0.76–1.27)
Glucose: 235 mg/dL — ABNORMAL HIGH (ref 65–99)
Potassium: 4.9 mmol/L (ref 3.5–5.2)
Sodium: 137 mmol/L (ref 134–144)
eGFR: 61 mL/min/{1.73_m2} (ref >59)

## 2020-12-21 LAB — PRO B NATRIURETIC PEPTIDE: NT-Pro BNP: 120 pg/mL (ref 0–376)

## 2020-12-21 LAB — TROPONIN T: Troponin T (Highly Sensitive): 44 ng/L (ref 0–22)

## 2020-12-21 NOTE — Telephone Encounter (Signed)
Spoke with patient to remind of missed remote transmission 

## 2020-12-22 ENCOUNTER — Other Ambulatory Visit: Payer: Self-pay

## 2020-12-22 ENCOUNTER — Ambulatory Visit: Payer: 59

## 2020-12-22 ENCOUNTER — Telehealth: Payer: Self-pay

## 2020-12-22 DIAGNOSIS — R0789 Other chest pain: Secondary | ICD-10-CM

## 2020-12-22 DIAGNOSIS — R0600 Dyspnea, unspecified: Secondary | ICD-10-CM

## 2020-12-22 DIAGNOSIS — R0609 Other forms of dyspnea: Secondary | ICD-10-CM

## 2020-12-22 LAB — CUP PACEART REMOTE DEVICE CHECK
Battery Remaining Longevity: 76 mo
Battery Remaining Percentage: 95.5 %
Battery Voltage: 2.99 V
Brady Statistic AP VP Percent: 1.9 %
Brady Statistic AP VS Percent: 95 %
Brady Statistic AS VP Percent: 1 %
Brady Statistic AS VS Percent: 2.6 %
Brady Statistic RA Percent Paced: 95 %
Brady Statistic RV Percent Paced: 1.9 %
Date Time Interrogation Session: 20220303183217
Implantable Lead Implant Date: 20090402
Implantable Lead Implant Date: 20200326
Implantable Lead Location: 753859
Implantable Lead Location: 753860
Implantable Pulse Generator Implant Date: 20200326
Lead Channel Impedance Value: 440 Ohm
Lead Channel Impedance Value: 460 Ohm
Lead Channel Pacing Threshold Amplitude: 0.5 V
Lead Channel Pacing Threshold Amplitude: 1.5 V
Lead Channel Pacing Threshold Pulse Width: 0.5 ms
Lead Channel Pacing Threshold Pulse Width: 0.5 ms
Lead Channel Sensing Intrinsic Amplitude: 3.5 mV
Lead Channel Sensing Intrinsic Amplitude: 4.2 mV
Lead Channel Setting Pacing Amplitude: 2.5 V
Lead Channel Setting Pacing Amplitude: 3.5 V
Lead Channel Setting Pacing Pulse Width: 0.5 ms
Lead Channel Setting Sensing Sensitivity: 0.7 mV
Pulse Gen Model: 2272
Pulse Gen Serial Number: 9120360

## 2020-12-22 NOTE — Telephone Encounter (Signed)
Patient is aware of results and to stop by today to repeat lab. Orders completed

## 2020-12-22 NOTE — Telephone Encounter (Signed)
-----   Message from Georgeanna Lea, MD sent at 12/22/2020  8:53 AM EST ----- Troponin I not normal.  Please recheck troponin I today

## 2020-12-22 NOTE — Progress Notes (Signed)
Complete echocardiogram performed.  Jimmy Sadako Cegielski RDCS, RVT  

## 2020-12-23 LAB — TROPONIN T: Troponin T (Highly Sensitive): 41 ng/L (ref 0–22)

## 2020-12-26 ENCOUNTER — Telehealth: Payer: Self-pay | Admitting: Cardiology

## 2020-12-26 DIAGNOSIS — R079 Chest pain, unspecified: Secondary | ICD-10-CM

## 2020-12-26 NOTE — Telephone Encounter (Signed)
Patient states he is returning a call regarding his lab results, but it looks like they were reviewed yesterday. Did anyone call the patient today? Please advise.

## 2020-12-26 NOTE — Telephone Encounter (Signed)
Called patient informed him that Dr. Bing Matter hasn't reviewed echo. Will inform him to do so.

## 2020-12-29 NOTE — Progress Notes (Signed)
Remote pacemaker transmission.   

## 2021-01-03 NOTE — Telephone Encounter (Signed)
Left message for patient to return call.

## 2021-01-03 NOTE — Telephone Encounter (Signed)
I have no idea why his echocardiogram did not cross to my box, I did look at his echocardiogram on the weekend.  Echocardiogram looks good, normal ejection fraction, no segmental wall motion abnormalities

## 2021-01-04 NOTE — Telephone Encounter (Signed)
Left message for patient to return call.

## 2021-01-09 NOTE — Telephone Encounter (Signed)
Left message for patient to return call.

## 2021-01-10 NOTE — Telephone Encounter (Signed)
Called patient informed him of echo results. Also informed him that Dr. Bing Matter would like him to have a lexiscan. Went over instructions with him and advised him they will call tomorrow with the appointment. He would like to be called between 730-8am if possible. I will let the schedulers know. No further questions.

## 2021-01-16 ENCOUNTER — Telehealth: Payer: Self-pay | Admitting: *Deleted

## 2021-01-16 NOTE — Telephone Encounter (Signed)
Left message on voicemail per DPR in reference to upcoming appointment scheduled on 01/23/21 at 0800 with detailed instructions given per Myocardial Perfusion Study Information Sheet for the test. LM to arrive 15 minutes early, and that it is imperative to arrive on time for appointment to keep from having the test rescheduled. If you need to cancel or reschedule your appointment, please call the office within 24 hours of your appointment. Failure to do so may result in a cancellation of your appointment, and a $50 no show fee. Phone number given for call back for any questions.  Aleka Twitty, Adelene Idler

## 2021-01-23 ENCOUNTER — Other Ambulatory Visit: Payer: Self-pay

## 2021-01-23 ENCOUNTER — Ambulatory Visit (INDEPENDENT_AMBULATORY_CARE_PROVIDER_SITE_OTHER): Payer: 59

## 2021-01-23 DIAGNOSIS — R079 Chest pain, unspecified: Secondary | ICD-10-CM | POA: Diagnosis not present

## 2021-01-23 LAB — MYOCARDIAL PERFUSION IMAGING
LV dias vol: 62 mL (ref 62–150)
LV sys vol: 21 mL
Peak HR: 80 {beats}/min
Rest HR: 60 {beats}/min
SDS: 3
SRS: 10
SSS: 13
TID: 0.99

## 2021-01-23 MED ORDER — REGADENOSON 0.4 MG/5ML IV SOLN
0.4000 mg | Freq: Once | INTRAVENOUS | Status: AC
Start: 2021-01-23 — End: 2021-01-23
  Administered 2021-01-23: 0.4 mg via INTRAVENOUS

## 2021-01-23 MED ORDER — TECHNETIUM TC 99M TETROFOSMIN IV KIT
32.5000 | PACK | Freq: Once | INTRAVENOUS | Status: AC | PRN
  Administered 2021-01-23: 32.5 via INTRAVENOUS

## 2021-01-23 MED ORDER — TECHNETIUM TC 99M TETROFOSMIN IV KIT
10.1000 | PACK | Freq: Once | INTRAVENOUS | Status: AC | PRN
  Administered 2021-01-23: 10.1 via INTRAVENOUS

## 2021-01-24 ENCOUNTER — Telehealth: Payer: Self-pay | Admitting: Emergency Medicine

## 2021-01-24 NOTE — Telephone Encounter (Signed)
Called patient informed him of stress test results he wants to know If he is able to start exercising again I informed him I will check with Dr. Bing Matter to be sure.

## 2021-03-21 ENCOUNTER — Other Ambulatory Visit: Payer: Self-pay

## 2021-03-21 ENCOUNTER — Ambulatory Visit (INDEPENDENT_AMBULATORY_CARE_PROVIDER_SITE_OTHER): Payer: 59

## 2021-03-21 DIAGNOSIS — I495 Sick sinus syndrome: Secondary | ICD-10-CM | POA: Diagnosis not present

## 2021-03-22 LAB — CUP PACEART REMOTE DEVICE CHECK
Battery Remaining Longevity: 78 mo
Battery Remaining Percentage: 95.5 %
Battery Voltage: 2.99 V
Brady Statistic AP VP Percent: 1.9 %
Brady Statistic AP VS Percent: 95 %
Brady Statistic AS VP Percent: 1 %
Brady Statistic AS VS Percent: 2.6 %
Brady Statistic RA Percent Paced: 95 %
Brady Statistic RV Percent Paced: 2 %
Date Time Interrogation Session: 20220602110609
Implantable Lead Implant Date: 20090402
Implantable Lead Implant Date: 20200326
Implantable Lead Location: 753859
Implantable Lead Location: 753860
Implantable Pulse Generator Implant Date: 20200326
Lead Channel Impedance Value: 460 Ohm
Lead Channel Impedance Value: 510 Ohm
Lead Channel Pacing Threshold Amplitude: 0.5 V
Lead Channel Pacing Threshold Amplitude: 1.5 V
Lead Channel Pacing Threshold Pulse Width: 0.5 ms
Lead Channel Pacing Threshold Pulse Width: 0.5 ms
Lead Channel Sensing Intrinsic Amplitude: 3.6 mV
Lead Channel Sensing Intrinsic Amplitude: 5.6 mV
Lead Channel Setting Pacing Amplitude: 2.5 V
Lead Channel Setting Pacing Amplitude: 3.5 V
Lead Channel Setting Pacing Pulse Width: 0.5 ms
Lead Channel Setting Sensing Sensitivity: 0.7 mV
Pulse Gen Model: 2272
Pulse Gen Serial Number: 9120360

## 2021-03-23 ENCOUNTER — Ambulatory Visit: Payer: 59 | Admitting: Cardiology

## 2021-03-23 ENCOUNTER — Other Ambulatory Visit: Payer: Self-pay

## 2021-03-23 ENCOUNTER — Encounter: Payer: Self-pay | Admitting: Cardiology

## 2021-03-23 VITALS — BP 100/68 | HR 60 | Ht 65.0 in | Wt 158.0 lb

## 2021-03-23 DIAGNOSIS — I2510 Atherosclerotic heart disease of native coronary artery without angina pectoris: Secondary | ICD-10-CM | POA: Diagnosis not present

## 2021-03-23 DIAGNOSIS — Z950 Presence of cardiac pacemaker: Secondary | ICD-10-CM | POA: Diagnosis not present

## 2021-03-23 DIAGNOSIS — E1165 Type 2 diabetes mellitus with hyperglycemia: Secondary | ICD-10-CM

## 2021-03-23 DIAGNOSIS — R0609 Other forms of dyspnea: Secondary | ICD-10-CM

## 2021-03-23 DIAGNOSIS — R0600 Dyspnea, unspecified: Secondary | ICD-10-CM

## 2021-03-23 NOTE — Addendum Note (Signed)
Addended by: Hazle Quant on: 03/23/2021 01:56 PM   Modules accepted: Orders

## 2021-03-23 NOTE — Progress Notes (Signed)
Cardiology Office Note:    Date:  03/23/2021   ID:  Terry Joyce, DOB Dec 29, 1950, MRN [id number]  PCP:  Ralene Ok, MD  Cardiologist:  Gypsy Balsam, MD    Referring MD: Ralene Ok, MD   Chief Complaint  Patient presents with  . Winded when active   I am short of breath  History of Present Illness:    Terry Joyce is a 70 y.o. male with past medical history significant for luminal coronary artery disease based on cardiac catheterization from September 2016, dyslipidemia, diabetes which is poorly controlled.  He was seen by me last time in March at that time we were talking about episode of dizziness which felt to be vertigo.  He was given meclizine with good response.  He does not take this medication anymore he is at this is not a problem he also describes 1 episode of chest pain.  I ended up doing a troponin I on him which was minimally elevated but stable.  After that stress test was done which showed no evidence of ischemia.  Ejection fraction was 67% by the time. He comes today to my office to follow-up.  Today he complained of having shortness of breath with exertion.  Otherwise denies any chest pain tightness squeezing pressure burning chest.  He said numerous is that like always he stopped a lot of medications.  Last time I spent real deal of time explained to him what the purpose of aspirin and still asking to take it on the regular basis he still does not take it.  He does not want to take anything for cholesterol.  He also tells me that he does not take his insulin on the regular basis.  This is a very frustrating situation.  Past Medical History:  Diagnosis Date  . Atypical chest pain 12/10/2018  . 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 Coronary artery disease involving native coronary artery of native heart without angina pectoris 07/13/2015   Overview:  Luminal disease by cardiac catheter from September 2016  . Dyslipidemia 06/29/2015  . Dyspnea on exertion 12/20/2020  .  Exertional chest pain 06/29/2015  . Normal coronary arteries 04/30/2018  . Pacemaker 12/10/2018  . Pacemaker reprogramming/check 06/29/2015  . Poorly controlled diabetes mellitus (HCC) 06/29/2015  . Pre-syncope 10/26/2017  . Tachycardia 10/26/2017  . Umbilical hernia without obstruction and without gangrene 12/14/2019    Past Surgical History:  Procedure Laterality Date  . CARDIAC CATHETERIZATION Left 07/04/2015   Surgeon: Derrek Gu, MD; Location: [facility] CATH; Service: Cardiology  . CARDIAC PACEMAKER PLACEMENT    . LEAD INSERTION Left 01/14/2019   Procedure: LEAD INSERTION;  Surgeon: Regan Lemming, MD;  Location: [facility] INVASIVE CV LAB;  Service: Cardiovascular;  Laterality: Left;  . PPM GENERATOR CHANGEOUT N/A 01/14/2019   Procedure: PPM GENERATOR CHANGEOUT;  Surgeon: Regan Lemming, MD;  Location: [facility] INVASIVE CV LAB;  Service: Cardiovascular;  Laterality: N/A;    Current Medications: Current Meds  Medication Sig  . EPINEPHrine (EPIPEN JR) 0.15 MG/0.3ML injection Inject 0.15 mg into the muscle as needed for anaphylaxis.   Marland Kitchen FIASP FLEXTOUCH 100 UNIT/ML SOPN Take 8-10 Units by mouth as needed (When glucose is elevated or low).  . insulin degludec (TRESIBA) 100 UNIT/ML SOPN FlexTouch Pen Inject 45 Units into the skin as needed (when glucose either elevated or low).     Allergies:   Penicillins   Social History   Socioeconomic History  . Marital status: Single    Spouse name: Not on  file  . Number of children: Not on file  . Years of education: Not on file  . Highest education level: Not on file  Occupational History  . Not on file  Tobacco Use  . Smoking status: Never Smoker  . Smokeless tobacco: Never Used  Vaping Use  . Vaping Use: Never used  Substance and Sexual Activity  . Alcohol use: Yes  . Drug use: Not Currently  . Sexual activity: Not on file  Other Topics Concern  . Not on file  Social History Narrative  . Not on file   Social Determinants of Health    Financial Resource Strain: Not on file  Food Insecurity: Not on file  Transportation Needs: Not on file  Physical Activity: Not on file  Stress: Not on file  Social Connections: Not on file     Family History: The patient's family history includes Colon cancer in his father; Diabetes in his mother. ROS:   Please see the history of present illness.    All 14 point review of systems negative except as described per history of present illness  EKGs/Labs/Other Studies Reviewed:      Recent Labs: 12/20/2020: BUN 14; Creatinine, Ser 1.26; NT-Pro BNP 120; Potassium 4.9; Sodium 137  Recent Lipid Panel No results found for: CHOL, TRIG, HDL, CHOLHDL, VLDL, LDLCALC, LDLDIRECT  Physical Exam:    VS:  BP 100/68 (BP Location: Left Arm, Patient Position: Sitting)   Pulse 60   Ht 5\' 5"  (1.651 m)   Wt 158 lb (71.7 kg)   SpO2 96%   BMI 26.29 kg/m     Wt Readings from Last 3 Encounters:  03/23/21 158 lb (71.7 kg)  01/23/21 158 lb (71.7 kg)  12/20/20 158 lb (71.7 kg)     GEN:  Well nourished, well developed in no acute distress HEENT: Normal NECK: No JVD; No carotid bruits LYMPHATICS: No lymphadenopathy CARDIAC: RRR, no murmurs, no rubs, no gallops RESPIRATORY:  Clear to auscultation without rales, wheezing or rhonchi  ABDOMEN: Soft, non-tender, non-distended MUSCULOSKELETAL:  No edema; No deformity  SKIN: Warm and dry LOWER EXTREMITIES: no swelling NEUROLOGIC:  Alert and oriented x 3 PSYCHIATRIC:  Normal affect   ASSESSMENT:    1. Dyspnea on exertion   2. Pacemaker   3. Poorly controlled diabetes mellitus (HCC)   4. Coronary artery disease involving native coronary artery of native heart without angina pectoris    PLAN:    In order of problems listed above:  1. Dyspnea on exertion: I will ask him to have proBNP done today.  So far I already have cardiac explanation for his symptomatology.  If proBNP will be still within normal limits consider referral to  pulmonary. 2. Pacemaker present is a Abbott device seems to be functioning normally. 3. Poorly controlled diabetes I will check his hemoglobin A1c today I did review the latest Chem-7 every single time he is glucose is uncontrolled 4. Coronary disease only luminal disease based on cardiac catheterization from 2016, his Lexiscan showed no evidence of ischemia.  Overall have expressed my frustration with him.  He does not take medications does not take aspirin does not follow his diabetes well that make of course prognosis guarded.  We will try to convince him to do what is right.   Medication Adjustments/Labs and Tests Ordered: Current medicines are reviewed at length with the patient today.  Concerns regarding medicines are outlined above.  No orders of the defined types were placed in this encounter.  Medication changes: No orders of the defined types were placed in this encounter.   Signed, Georgeanna Lea, MD, [facility] 03/23/2021 1:47 PM    [facility]

## 2021-03-23 NOTE — Patient Instructions (Signed)
Medication Instructions:  Your physician recommends that you continue on your current medications as directed. Please refer to the Current Medication list given to you today.  *If you need a refill on your cardiac medications before your next appointment, please call your pharmacy*   Lab Work: Your physician recommends that you return for lab work today: pro bnp, hemoglobin a 1 c  If you have labs (blood work) drawn today and your tests are completely normal, you will receive your results only by: Marland Kitchen MyChart Message (if you have MyChart) OR . A paper copy in the mail If you have any lab test that is abnormal or we need to change your treatment, we will call you to review the results.   Testing/Procedures: None   Follow-Up: At [facility], you and your health needs are our priority.  As part of our continuing mission to provide you with exceptional heart care, we have created designated Provider Care Teams.  These Care Teams include your primary Cardiologist (physician) and Advanced Practice Providers (APPs -  Physician Assistants and Nurse Practitioners) who all work together to provide you with the care you need, when you need it.  We recommend signing up for the patient portal called "MyChart".  Sign up information is provided on this After Visit Summary.  MyChart is used to connect with patients for Virtual Visits (Telemedicine).  Patients are able to view lab/test results, encounter notes, upcoming appointments, etc.  Non-urgent messages can be sent to your provider as well.   To learn more about what you can do with MyChart, go to [email or web address].    Your next appointment:   3 month(s)  The format for your next appointment:   In Person  Provider:   Gypsy Balsam, MD   Other Instructions

## 2021-03-24 LAB — HEMOGLOBIN A1C
Est. average glucose Bld gHb Est-mCnc: 212 mg/dL
Hgb A1c MFr Bld: 9 % — ABNORMAL HIGH (ref 4.8–5.6)

## 2021-03-24 LAB — PRO B NATRIURETIC PEPTIDE: NT-Pro BNP: 226 pg/mL (ref 0–376)

## 2021-03-26 ENCOUNTER — Telehealth: Payer: Self-pay

## 2021-03-26 DIAGNOSIS — R0609 Other forms of dyspnea: Secondary | ICD-10-CM

## 2021-03-26 DIAGNOSIS — R0600 Dyspnea, unspecified: Secondary | ICD-10-CM

## 2021-03-26 NOTE — Telephone Encounter (Signed)
-----   Message from Georgeanna Lea, MD sent at 03/26/2021 10:28 AM EDT ----- There is no evidence of congestive heart failure based on laboratory test as well as clinical examination, however, he is diabetes is poorly controlled his hemoglobin A1c is 9.0.  Please refer him to pulmonary in [facility] for evaluation of shortness of breath

## 2021-03-26 NOTE — Telephone Encounter (Signed)
Patient advised of results and recommendations and agree w/ plan Referral sent.

## 2021-04-13 NOTE — Progress Notes (Signed)
Remote pacemaker transmission.   

## 2021-04-24 ENCOUNTER — Institutional Professional Consult (permissible substitution): Payer: 59 | Admitting: Pulmonary Disease

## 2021-04-25 ENCOUNTER — Encounter: Payer: Self-pay | Admitting: Pulmonary Disease

## 2021-04-25 ENCOUNTER — Ambulatory Visit (INDEPENDENT_AMBULATORY_CARE_PROVIDER_SITE_OTHER): Payer: 59

## 2021-04-25 ENCOUNTER — Other Ambulatory Visit: Payer: Self-pay

## 2021-04-25 ENCOUNTER — Ambulatory Visit: Payer: 59 | Admitting: Pulmonary Disease

## 2021-04-25 VITALS — BP 112/68 | HR 69 | Ht 65.0 in | Wt 142.0 lb

## 2021-04-25 DIAGNOSIS — R0602 Shortness of breath: Secondary | ICD-10-CM

## 2021-04-25 MED ORDER — ALBUTEROL SULFATE HFA 108 (90 BASE) MCG/ACT IN AERS: 2.0000 | INHALATION_SPRAY | Freq: Four times a day (QID) | RESPIRATORY_TRACT | 6 refills | Status: AC | PRN

## 2021-04-25 NOTE — Patient Instructions (Addendum)
Use albuterol 1-2 puffs every 4-6 hours as needed for shortness of breath  We will schedule you for pulmonary function tests in the next week or two.  We will check a chest radiograph today

## 2021-04-25 NOTE — Progress Notes (Signed)
Synopsis: Referred in July 2022 for shortness of breath by Gypsy Balsam, MD  Subjective:   PATIENT ID: Terry Joyce GENDER: male DOB: 11/10/1950, MRN: [id number]   HPI  Chief Complaint  Patient presents with   Consult    Dyspnea for some time   Terry Joyce is a 70 year old male, never smoker with diabetes mellitus who is referred to pulmonary clinic for shortness of breath.   He reports having progressive exertional shortness of breath over the past few years. He now has dyspnea after 5-10 minutes of walking on the treadmill compared to 15-20 minutes before. He denies cough or wheezing. He denies waking up from sleep with shortness of breath. He does experience some chest tightness with the dyspnea on exertion.   He had a pacemaker placed 10 years ago for sick sinus syndrome. He has been followed by cardiology for his dyspnea. Echo 12/2020 shows LVEF 55-60%, normal LV function and size. Grade III diastolic dysfunction. RV systolic function is normal. RV size is normal. Normal PASP. Valves are normal.    He is a never smoker. He has been a Therapist, sports for 38 years. He is originally from [place].  Past Medical History:  Diagnosis Date   Atypical chest pain 12/10/2018   Bradycardia 06/29/2015   Coronary artery disease involving native coronary artery of native heart without angina pectoris 07/13/2015   Overview:  Luminal disease by cardiac catheter from September 2016   Dyslipidemia 06/29/2015   Dyspnea on exertion 12/20/2020   Exertional chest pain 06/29/2015   Normal coronary arteries 04/30/2018   Pacemaker 12/10/2018   Pacemaker reprogramming/check 06/29/2015   Poorly controlled diabetes mellitus (HCC) 06/29/2015   Pre-syncope 10/26/2017   Tachycardia 10/26/2017   Umbilical hernia without obstruction and without gangrene 12/14/2019     Family History  Problem Relation Age of Onset   Diabetes Mother    Colon cancer Father      Social History   Socioeconomic History    Marital status: Single    Spouse name: Not on file   Number of children: Not on file   Years of education: Not on file   Highest education level: Not on file  Occupational History   Not on file  Tobacco Use   Smoking status: Never   Smokeless tobacco: Never  Vaping Use   Vaping Use: Never used  Substance and Sexual Activity   Alcohol use: Yes   Drug use: Not Currently   Sexual activity: Not on file  Other Topics Concern   Not on file  Social History Narrative   Not on file   Social Determinants of Health   Financial Resource Strain: Not on file  Food Insecurity: Not on file  Transportation Needs: Not on file  Physical Activity: Not on file  Stress: Not on file  Social Connections: Not on file  Intimate Partner Violence: Not on file     Allergies  Allergen Reactions   Penicillins Swelling    SWELLING REACTION UNSPECIFIED  Did it involve swelling of the face/tongue/throat, SOB, or low BP? No Did it involve sudden or severe rash/hives, skin peeling, or any reaction on the inside of your mouth or nose? No Did you need to seek medical attention at a hospital or doctor's office? No When did it last happen?       If all above answers are "NO", may proceed with cephalosporin use.      Outpatient Medications Prior to Visit  Medication Sig Dispense  Refill   EPINEPHrine (EPIPEN JR) 0.15 MG/0.3ML injection Inject 0.15 mg into the muscle as needed for anaphylaxis.      FIASP FLEXTOUCH 100 UNIT/ML SOPN Take 8-10 Units by mouth as needed (When glucose is elevated or low).     insulin degludec (TRESIBA) 100 UNIT/ML SOPN FlexTouch Pen Inject 45 Units into the skin as needed (when glucose either elevated or low).     tamsulosin (FLOMAX) 0.4 MG CAPS capsule Take 1 capsule by mouth daily as needed (prostate).     No facility-administered medications prior to visit.    Review of Systems  Constitutional:  Negative for chills, fever, malaise/fatigue and weight loss.  HENT:  Negative  for congestion, sinus pain and sore throat.   Eyes: Negative.   Respiratory:  Positive for shortness of breath. Negative for cough, hemoptysis, sputum production and wheezing.   Cardiovascular:  Negative for chest pain, palpitations, orthopnea, claudication and leg swelling.  Gastrointestinal:  Negative for abdominal pain, heartburn, nausea and vomiting.  Genitourinary: Negative.   Musculoskeletal:  Negative for joint pain and myalgias.  Skin:  Negative for rash.  Neurological:  Negative for weakness.  Endo/Heme/Allergies: Negative.   Psychiatric/Behavioral: Negative.      Objective:   Vitals:   04/25/21 1518  BP: 112/68  Pulse: 69  SpO2: 98%  Weight: 142 lb (64.4 kg)  Height: 5\' 5"  (1.651 m)     Physical Exam Constitutional:      General: He is not in acute distress.    Appearance: Normal appearance.  HENT:     Head: Normocephalic and atraumatic.  Eyes:     Extraocular Movements: Extraocular movements intact.     Conjunctiva/sclera: Conjunctivae normal.     Pupils: Pupils are equal, round, and reactive to light.  Cardiovascular:     Rate and Rhythm: Normal rate and regular rhythm.     Pulses: Normal pulses.     Heart sounds: Normal heart sounds. No murmur heard. Pulmonary:     Effort: Pulmonary effort is normal.     Breath sounds: Decreased breath sounds present. No wheezing, rhonchi or rales.  Abdominal:     General: Bowel sounds are normal.     Palpations: Abdomen is soft.  Musculoskeletal:     Right lower leg: No edema.     Left lower leg: No edema.  Lymphadenopathy:     Cervical: No cervical adenopathy.  Skin:    General: Skin is warm and dry.  Neurological:     General: No focal deficit present.     Mental Status: He is alert.  Psychiatric:        Mood and Affect: Mood normal.        Behavior: Behavior normal.        Thought Content: Thought content normal.        Judgment: Judgment normal.    CBC    Component Value Date/Time   WBC 7.8 12/22/2018  1332   RBC 5.20 12/22/2018 1332   HGB 15.1 12/22/2018 1332   HCT 44.7 12/22/2018 1332   PLT 203 12/22/2018 1332   MCV 86 12/22/2018 1332   MCH 29.0 12/22/2018 1332   MCHC 33.8 12/22/2018 1332   RDW 13.1 12/22/2018 1332   LYMPHSABS 2.1 12/10/2018 1016   EOSABS 0.3 12/10/2018 1016   BASOSABS 0.1 12/10/2018 1016     Chest imaging: 04/25/21 No pleural effusions, infiltrates or opacities. No signs of hyperinflation.  CXR 6/2/20202 Left subclavian sequential transvenous pacemaker leads project at right  atrium and right ventricle.   Normal heart size, mediastinal contours, and pulmonary vascularity.   Eventration LEFT diaphragm unchanged.  PFT: PFT Results Latest Ref Rng & Units 05/02/2021  FVC-Pre L 3.28  FVC-Predicted Pre % 88  FVC-Post L 3.26  FVC-Predicted Post % 88  Pre FEV1/FVC % % 83  Post FEV1/FCV % % 84  FEV1-Pre L 2.72  FEV1-Predicted Pre % 100  FEV1-Post L 2.72  DLCO uncorrected ml/min/mmHg 23.48  DLCO UNC% % 104  DLCO corrected ml/min/mmHg 23.48  DLCO COR %Predicted % 104  DLVA Predicted % 103  TLC L 6.83  TLC % Predicted % 111  RV % Predicted % 131    Echo 12/22/20: LVEF 55-60%, normal LV function and size. Grade III diastolic dysfunction. RV systolic function is normal. RV size is normal. Normal PASP. Valves are normal.   Assessment & Plan:   Shortness of breath - Plan: Pulmonary Function Test, DG Chest 2 View, albuterol (VENTOLIN HFA) 108 (90 Base) MCG/ACT inhaler  Discussion: Ehtan Delfavero is a 70 year old male, never smoker with diabetes mellitus who is referred to pulmonary clinic for shortness of breath.   His chest radiograph and pulmonary function tests are unremarkable. We will provide him with an albuterol inhaler as needed for these episodes of shortness of breath with exertion and monitor for symptom improvement.   I believe his dyspnea is most likely related to his diastolic heart failure as noted on his TTE from 12/2020. He appears  euvolemic at this time and he is following with cardiology as well.  Follow up in 6 weeks.  Melody Comas, MD [facility] Office: [phone]    Current Outpatient Medications:    albuterol (VENTOLIN HFA) 108 (90 Base) MCG/ACT inhaler, Inhale 2 puffs into the lungs every 6 (six) hours as needed for wheezing or shortness of breath., Disp: 8 g, Rfl: 6   EPINEPHrine (EPIPEN JR) 0.15 MG/0.3ML injection, Inject 0.15 mg into the muscle as needed for anaphylaxis. , Disp: , Rfl:    FIASP FLEXTOUCH 100 UNIT/ML SOPN, Take 8-10 Units by mouth as needed (When glucose is elevated or low)., Disp: , Rfl:    insulin degludec (TRESIBA) 100 UNIT/ML SOPN FlexTouch Pen, Inject 45 Units into the skin as needed (when glucose either elevated or low)., Disp: , Rfl:    tamsulosin (FLOMAX) 0.4 MG CAPS capsule, Take 1 capsule by mouth daily as needed (prostate)., Disp: , Rfl:

## 2021-05-02 ENCOUNTER — Other Ambulatory Visit: Payer: Self-pay

## 2021-05-02 ENCOUNTER — Ambulatory Visit (INDEPENDENT_AMBULATORY_CARE_PROVIDER_SITE_OTHER): Payer: 59 | Admitting: Pulmonary Disease

## 2021-05-02 DIAGNOSIS — R0602 Shortness of breath: Secondary | ICD-10-CM | POA: Diagnosis not present

## 2021-05-02 LAB — PULMONARY FUNCTION TEST
DL/VA % pred: 103 %
DL/VA: 4.28 ml/min/mmHg/L
DLCO cor % pred: 104 %
DLCO cor: 23.48 ml/min/mmHg
DLCO unc % pred: 104 %
DLCO unc: 23.48 ml/min/mmHg
FEF 25-75 Post: 3.17 L/sec
FEF 25-75 Pre: 3.08 L/sec
FEF2575-%Change-Post: 3 %
FEF2575-%Pred-Post: 153 %
FEF2575-%Pred-Pre: 149 %
FEV1-%Change-Post: 0 %
FEV1-%Pred-Post: 100 %
FEV1-%Pred-Pre: 100 %
FEV1-Post: 2.72 L
FEV1-Pre: 2.72 L
FEV1FVC-%Change-Post: 0 %
FEV1FVC-%Pred-Pre: 112 %
FEV6-%Change-Post: 0 %
FEV6-%Pred-Post: 93 %
FEV6-%Pred-Pre: 93 %
FEV6-Post: 3.22 L
FEV6-Pre: 3.22 L
FEV6FVC-%Change-Post: 0 %
FEV6FVC-%Pred-Post: 105 %
FEV6FVC-%Pred-Pre: 105 %
FVC-%Change-Post: 0 %
FVC-%Pred-Post: 88 %
FVC-%Pred-Pre: 88 %
FVC-Post: 3.26 L
FVC-Pre: 3.28 L
Post FEV1/FVC ratio: 84 %
Post FEV6/FVC ratio: 99 %
Pre FEV1/FVC ratio: 83 %
Pre FEV6/FVC Ratio: 99 %
RV % pred: 131 %
RV: 2.89 L
TLC % pred: 111 %
TLC: 6.83 L

## 2021-05-02 NOTE — Progress Notes (Signed)
PFT done today. 

## 2021-05-16 ENCOUNTER — Encounter: Payer: Self-pay | Admitting: Pulmonary Disease

## 2021-05-30 ENCOUNTER — Encounter: Payer: Self-pay | Admitting: Pulmonary Disease

## 2021-05-30 ENCOUNTER — Other Ambulatory Visit: Payer: Self-pay

## 2021-05-30 ENCOUNTER — Ambulatory Visit: Payer: 59 | Admitting: Pulmonary Disease

## 2021-05-30 VITALS — BP 118/70 | HR 60 | Ht 65.0 in | Wt 144.0 lb

## 2021-05-30 DIAGNOSIS — R0602 Shortness of breath: Secondary | ICD-10-CM

## 2021-05-30 MED ORDER — ANORO ELLIPTA 62.5-25 MCG/INH IN AEPB: 1.0000 | INHALATION_SPRAY | Freq: Every day | RESPIRATORY_TRACT | 0 refills | Status: DC

## 2021-05-30 NOTE — Patient Instructions (Signed)
Try anoro ellipta inhaler 1 puff daily over the next 4 weeks - Give [place] a call if you notice benefit from the inhalers  I will discuss our visit today with your heart doctor as I think your shortness of breath is coming from diastolic heart failure (your heart is not relaxing normally between each beat).

## 2021-05-30 NOTE — Progress Notes (Signed)
Synopsis: Referred in July 2022 for shortness of breath by Gypsy Balsam, MD  Subjective:   PATIENT ID: Terry Joyce GENDER: male DOB: 1951/04/21, MRN: [id number]   HPI  Chief Complaint  Patient presents with   Follow-up    6 wk f/u, states the albuterol did not help. Completed his PFT last month. States his breathing has been stable since last visit.    Terry Joyce is a 70 year old male, never smoker with diabetes mellitus who returns to pulmonary clinic for shortness of breath.   He continues to have exertional shortness of breath.  He was provided with albuterol inhaler at the last visit and has not noticed any benefit with exertional activity.  He had pulmonary function tests on 05/16/2021 which are within normal limits.  OV 04/25/21 He reports having progressive exertional shortness of breath over the past few years. He now has dyspnea after 5-10 minutes of walking on the treadmill compared to 15-20 minutes before. He denies cough or wheezing. He denies waking up from sleep with shortness of breath. He does experience some chest tightness with the dyspnea on exertion.   He had a pacemaker placed 10 years ago for sick sinus syndrome. He has been followed by cardiology for his dyspnea. Echo 12/2020 shows LVEF 55-60%, normal LV function and size. Grade III diastolic dysfunction. RV systolic function is normal. RV size is normal. Normal PASP. Valves are normal.    He is a never smoker. He has been a Therapist, sports for 38 years. He is originally from [place].  Past Medical History:  Diagnosis Date   Atypical chest pain 12/10/2018   Bradycardia 06/29/2015   Coronary artery disease involving native coronary artery of native heart without angina pectoris 07/13/2015   Overview:  Luminal disease by cardiac catheter from September 2016   Dyslipidemia 06/29/2015   Dyspnea on exertion 12/20/2020   Exertional chest pain 06/29/2015   Normal coronary arteries 04/30/2018   Pacemaker  12/10/2018   Pacemaker reprogramming/check 06/29/2015   Poorly controlled diabetes mellitus (HCC) 06/29/2015   Pre-syncope 10/26/2017   Tachycardia 10/26/2017   Umbilical hernia without obstruction and without gangrene 12/14/2019     Family History  Problem Relation Age of Onset   Diabetes Mother    Colon cancer Father      Social History   Socioeconomic History   Marital status: Single    Spouse name: Not on file   Number of children: Not on file   Years of education: Not on file   Highest education level: Not on file  Occupational History   Not on file  Tobacco Use   Smoking status: Never   Smokeless tobacco: Never  Vaping Use   Vaping Use: Never used  Substance and Sexual Activity   Alcohol use: Yes   Drug use: Not Currently   Sexual activity: Not on file  Other Topics Concern   Not on file  Social History Narrative   Not on file   Social Determinants of Health   Financial Resource Strain: Not on file  Food Insecurity: Not on file  Transportation Needs: Not on file  Physical Activity: Not on file  Stress: Not on file  Social Connections: Not on file  Intimate Partner Violence: Not on file     Allergies  Allergen Reactions   Penicillins Swelling    SWELLING REACTION UNSPECIFIED  Did it involve swelling of the face/tongue/throat, SOB, or low BP? No Did it involve sudden or severe rash/hives, skin  peeling, or any reaction on the inside of your mouth or nose? No Did you need to seek medical attention at a hospital or doctor's office? No When did it last happen?       If all above answers are "NO", may proceed with cephalosporin use.      Outpatient Medications Prior to Visit  Medication Sig Dispense Refill   albuterol (VENTOLIN HFA) 108 (90 Base) MCG/ACT inhaler Inhale 2 puffs into the lungs every 6 (six) hours as needed for wheezing or shortness of breath. 8 g 6   EPINEPHrine (EPIPEN JR) 0.15 MG/0.3ML injection Inject 0.15 mg into the muscle as needed for  anaphylaxis.      FIASP FLEXTOUCH 100 UNIT/ML SOPN Take 8-10 Units by mouth as needed (When glucose is elevated or low).     insulin degludec (TRESIBA) 100 UNIT/ML SOPN FlexTouch Pen Inject 45 Units into the skin as needed (when glucose either elevated or low).     Semaglutide (OZEMPIC, 0.25 OR 0.5 MG/DOSE, [place]) Inject into the skin.     tamsulosin (FLOMAX) 0.4 MG CAPS capsule Take 1 capsule by mouth daily as needed (prostate).     No facility-administered medications prior to visit.    Review of Systems  Constitutional:  Negative for chills, fever, malaise/fatigue and weight loss.  HENT:  Negative for congestion, sinus pain and sore throat.   Eyes: Negative.   Respiratory:  Positive for shortness of breath. Negative for cough, hemoptysis, sputum production and wheezing.   Cardiovascular:  Negative for chest pain, palpitations, orthopnea, claudication and leg swelling.  Gastrointestinal:  Negative for abdominal pain, heartburn, nausea and vomiting.  Genitourinary: Negative.   Musculoskeletal:  Negative for joint pain and myalgias.  Skin:  Negative for rash.  Neurological:  Negative for weakness.  Endo/Heme/Allergies: Negative.   Psychiatric/Behavioral: Negative.      Objective:   Vitals:   05/30/21 1212  BP: 118/70  Pulse: 60  SpO2: 98%  Weight: 144 lb (65.3 kg)  Height: 5\' 5"  (1.651 m)     Physical Exam Constitutional:      General: He is not in acute distress.    Appearance: Normal appearance.  HENT:     Head: Normocephalic and atraumatic.  Eyes:     Extraocular Movements: Extraocular movements intact.     Conjunctiva/sclera: Conjunctivae normal.     Pupils: Pupils are equal, round, and reactive to light.  Cardiovascular:     Rate and Rhythm: Normal rate and regular rhythm.     Pulses: Normal pulses.     Heart sounds: Normal heart sounds. No murmur heard. Pulmonary:     Effort: Pulmonary effort is normal.     Breath sounds: No decreased breath sounds, wheezing,  rhonchi or rales.  Abdominal:     General: Bowel sounds are normal.     Palpations: Abdomen is soft.  Musculoskeletal:     Right lower leg: No edema.     Left lower leg: No edema.  Lymphadenopathy:     Cervical: No cervical adenopathy.  Skin:    General: Skin is warm and dry.  Neurological:     General: No focal deficit present.     Mental Status: He is alert.  Psychiatric:        Mood and Affect: Mood normal.        Behavior: Behavior normal.        Thought Content: Thought content normal.        Judgment: Judgment normal.  CBC    Component Value Date/Time   WBC 7.8 12/22/2018 1332   RBC 5.20 12/22/2018 1332   HGB 15.1 12/22/2018 1332   HCT 44.7 12/22/2018 1332   PLT 203 12/22/2018 1332   MCV 86 12/22/2018 1332   MCH 29.0 12/22/2018 1332   MCHC 33.8 12/22/2018 1332   RDW 13.1 12/22/2018 1332   LYMPHSABS 2.1 12/10/2018 1016   EOSABS 0.3 12/10/2018 1016   BASOSABS 0.1 12/10/2018 1016   Chest imaging: 04/25/21 No pleural effusions, infiltrates or opacities. No signs of hyperinflation.  CXR 6/2/20202 Left subclavian sequential transvenous pacemaker leads project at right atrium and right ventricle.   Normal heart size, mediastinal contours, and pulmonary vascularity.   Eventration LEFT diaphragm unchanged.  PFT: PFT Results Latest Ref Rng & Units 05/02/2021  FVC-Pre L 3.28  FVC-Predicted Pre % 88  FVC-Post L 3.26  FVC-Predicted Post % 88  Pre FEV1/FVC % % 83  Post FEV1/FCV % % 84  FEV1-Pre L 2.72  FEV1-Predicted Pre % 100  FEV1-Post L 2.72  DLCO uncorrected ml/min/mmHg 23.48  DLCO UNC% % 104  DLCO corrected ml/min/mmHg 23.48  DLCO COR %Predicted % 104  DLVA Predicted % 103  TLC L 6.83  TLC % Predicted % 111  RV % Predicted % 131  PFT 2022: Within normal limits.  Echo 12/22/20: LVEF 55-60%, normal LV function and size. Grade III diastolic dysfunction. RV systolic function is normal. RV size is normal. Normal PASP. Valves are normal.   Assessment &  Plan:   Shortness of breath  Discussion: Moe Graca is a 70 year old male, never smoker with diabetes mellitus who returns to pulmonary clinic for shortness of breath.   His chest radiograph and pulmonary function tests are unremarkable.  He has not had any benefit from as needed albuterol use with exertion.  I do not believe his dyspnea is related to an underlying pulmonary issue but we will provide him with a maintenance inhaler trial of an oral Ellipta 1 puff daily over the next 4 weeks.    I believe his dyspnea is most likely related to his diastolic heart failure as noted on his TTE from 12/2020.  He has a pacemaker in place but may need chronotropic control when exerting himself.  I will discuss today's visit with his cardiology team and we may consider a cardiopulmonary exercise test to further delineate his dyspnea symptoms.  Follow-up as needed.  Melody Comas, MD [facility] Office: [phone]    Current Outpatient Medications:    albuterol (VENTOLIN HFA) 108 (90 Base) MCG/ACT inhaler, Inhale 2 puffs into the lungs every 6 (six) hours as needed for wheezing or shortness of breath., Disp: 8 g, Rfl: 6   EPINEPHrine (EPIPEN JR) 0.15 MG/0.3ML injection, Inject 0.15 mg into the muscle as needed for anaphylaxis. , Disp: , Rfl:    FIASP FLEXTOUCH 100 UNIT/ML SOPN, Take 8-10 Units by mouth as needed (When glucose is elevated or low)., Disp: , Rfl:    insulin degludec (TRESIBA) 100 UNIT/ML SOPN FlexTouch Pen, Inject 45 Units into the skin as needed (when glucose either elevated or low)., Disp: , Rfl:    Semaglutide (OZEMPIC, 0.25 OR 0.5 MG/DOSE, [place]), Inject into the skin., Disp: , Rfl:    tamsulosin (FLOMAX) 0.4 MG CAPS capsule, Take 1 capsule by mouth daily as needed (prostate)., Disp: , Rfl:    umeclidinium-vilanterol (ANORO ELLIPTA) 62.5-25 MCG/INH AEPB, Inhale 1 puff into the lungs daily., Disp: 2 each, Rfl:  0   

## 2021-05-31 ENCOUNTER — Encounter: Payer: Self-pay | Admitting: Pulmonary Disease

## 2021-06-20 ENCOUNTER — Ambulatory Visit (INDEPENDENT_AMBULATORY_CARE_PROVIDER_SITE_OTHER): Payer: 59

## 2021-06-20 DIAGNOSIS — I495 Sick sinus syndrome: Secondary | ICD-10-CM

## 2021-06-26 LAB — CUP PACEART REMOTE DEVICE CHECK
Battery Remaining Longevity: 53 mo
Battery Remaining Percentage: 69 %
Battery Voltage: 2.99 V
Brady Statistic AP VP Percent: 1.9 %
Brady Statistic AP VS Percent: 95 %
Brady Statistic AS VP Percent: 1 %
Brady Statistic AS VS Percent: 2.6 %
Brady Statistic RA Percent Paced: 95 %
Brady Statistic RV Percent Paced: 1.9 %
Date Time Interrogation Session: 20220901234617
Implantable Lead Implant Date: 20090402
Implantable Lead Implant Date: 20200326
Implantable Lead Location: 753859
Implantable Lead Location: 753860
Implantable Pulse Generator Implant Date: 20200326
Lead Channel Impedance Value: 450 Ohm
Lead Channel Impedance Value: 490 Ohm
Lead Channel Pacing Threshold Amplitude: 0.5 V
Lead Channel Pacing Threshold Amplitude: 1.5 V
Lead Channel Pacing Threshold Pulse Width: 0.5 ms
Lead Channel Pacing Threshold Pulse Width: 0.5 ms
Lead Channel Sensing Intrinsic Amplitude: 3.6 mV
Lead Channel Sensing Intrinsic Amplitude: 4.9 mV
Lead Channel Setting Pacing Amplitude: 2.5 V
Lead Channel Setting Pacing Amplitude: 3.5 V
Lead Channel Setting Pacing Pulse Width: 0.5 ms
Lead Channel Setting Sensing Sensitivity: 0.7 mV
Pulse Gen Model: 2272
Pulse Gen Serial Number: 9120360

## 2021-07-03 NOTE — Progress Notes (Signed)
Remote pacemaker transmission.   

## 2021-07-20 ENCOUNTER — Encounter: Payer: Self-pay | Admitting: Cardiology

## 2021-07-20 ENCOUNTER — Other Ambulatory Visit: Payer: Self-pay

## 2021-07-20 ENCOUNTER — Ambulatory Visit: Payer: 59 | Admitting: Cardiology

## 2021-07-20 VITALS — BP 108/68 | HR 93 | Ht 65.0 in | Wt 148.2 lb

## 2021-07-20 DIAGNOSIS — E785 Hyperlipidemia, unspecified: Secondary | ICD-10-CM

## 2021-07-20 DIAGNOSIS — I2510 Atherosclerotic heart disease of native coronary artery without angina pectoris: Secondary | ICD-10-CM | POA: Diagnosis not present

## 2021-07-20 DIAGNOSIS — Z950 Presence of cardiac pacemaker: Secondary | ICD-10-CM

## 2021-07-20 DIAGNOSIS — E1165 Type 2 diabetes mellitus with hyperglycemia: Secondary | ICD-10-CM | POA: Diagnosis not present

## 2021-07-20 NOTE — Patient Instructions (Signed)

## 2021-07-20 NOTE — Progress Notes (Signed)
Cardiology Office Note:    Date:  07/20/2021   ID:  Terry Joyce, DOB 15-Nov-1950, MRN [id number]  PCP:  Ralene Ok, MD  Cardiologist:  Gypsy Balsam, MD    Referring MD: Ralene Ok, MD   Chief Complaint  Patient presents with   Follow-up    History of Present Illness:    Terry Joyce is a 70 y.o. male past medical history significant for coronary artery disease luminal disease based on cardiac catheterization 2016, then he got stress test done this year which showed no evidence of ischemia, dyslipidemia, diabetes which is poorly controlled.  He has been coming to me because of shortness of breath lately eventually I end up sending him to pulmonary who did a quite aggressive investigation which was practically unrevealing there are some concern about him having diastolic dysfunction and he threw his echocardiogram done last time showed diastolic dysfunction but assessment of diastolic function somebody with paced rhythm is very difficult and probably unreliable.  I brought him today to my office to talk about potential options for the situation.  He said he is doing fine he denies have any chest pain tightness squeezing pressure burning chest.  But I am afraid he is downgrading see symptoms.  He tells me that nothing bothers him he meditates he takes few breaths and usually is he is fine.  He complained of having some pain on the right side of her chest I offered him nitroglycerin with instruction to take it on as-needed basis when he learned that nitroglycerin can give him headache he does not want to do that.  Past Medical History:  Diagnosis Date   Atypical chest pain 12/10/2018   Bradycardia 06/29/2015   Coronary artery disease involving native coronary artery of native heart without angina pectoris 07/13/2015   Overview:  Luminal disease by cardiac catheter from September 2016   Dyslipidemia 06/29/2015   Dyspnea on exertion 12/20/2020   Exertional chest pain 06/29/2015    Normal coronary arteries 04/30/2018   Pacemaker 12/10/2018   Pacemaker reprogramming/check 06/29/2015   Poorly controlled diabetes mellitus (HCC) 06/29/2015   Pre-syncope 10/26/2017   Tachycardia 10/26/2017   Umbilical hernia without obstruction and without gangrene 12/14/2019    Past Surgical History:  Procedure Laterality Date   CARDIAC CATHETERIZATION Left 07/04/2015   Surgeon: Derrek Gu, MD; Location: [facility] CATH; Service: Cardiology   CARDIAC PACEMAKER PLACEMENT     LEAD INSERTION Left 01/14/2019   Procedure: LEAD INSERTION;  Surgeon: Regan Lemming, MD;  [facility];  Service: Cardiovascular;  Laterality: Left;   PPM GENERATOR CHANGEOUT N/A 01/14/2019   Procedure: PPM GENERATOR CHANGEOUT;  Surgeon: Regan Lemming, MD;  [facility]: [facility];  Service: Cardiovascular;  Laterality: N/A;    Current Medications: Current Meds  Medication Sig   albuterol (VENTOLIN HFA) 108 (90 Base) MCG/ACT inhaler Inhale 2 puffs into the lungs every 6 (six) hours as needed for wheezing or shortness of breath.   EPINEPHrine (EPIPEN JR) 0.15 MG/0.3ML injection Inject 0.15 mg into the muscle as needed for anaphylaxis.    FIASP FLEXTOUCH 100 UNIT/ML SOPN Take 8-10 Units by mouth as needed (When glucose is elevated or low).   insulin degludec (TRESIBA) 100 UNIT/ML SOPN FlexTouch Pen Inject 45 Units into the skin as needed (when glucose either elevated or low).   Semaglutide (OZEMPIC, 0.25 OR 0.5 MG/DOSE, [place]) Inject 0.25 mg into the skin daily. Patient is not aware of strength   tamsulosin (FLOMAX) 0.4  MG CAPS capsule Take 1 capsule by mouth daily as needed (prostate).   umeclidinium-vilanterol (ANORO ELLIPTA) 62.5-25 MCG/INH AEPB Inhale 1 puff into the lungs daily.     Allergies:   Penicillins   Social History   Socioeconomic History   Marital status: Single    Spouse name: Not on file   Number of children: Not on file   Years of education: Not on file   Highest  education level: Not on file  Occupational History   Not on file  Tobacco Use   Smoking status: Never   Smokeless tobacco: Never  Vaping Use   Vaping Use: Never used  Substance and Sexual Activity   Alcohol use: Yes   Drug use: Not Currently   Sexual activity: Not on file  Other Topics Concern   Not on file  Social History Narrative   Not on file   Social Determinants of Health   Financial Resource Strain: Not on file  Food Insecurity: Not on file  Transportation Needs: Not on file  Physical Activity: Not on file  Stress: Not on file  Social Connections: Not on file     Family History: The patient's family history includes Colon cancer in his father; Diabetes in his mother. ROS:   Please see the history of present illness.    All 14 point review of systems negative except as described per history of present illness  EKGs/Labs/Other Studies Reviewed:      Recent Labs: 12/20/2020: BUN 14; Creatinine, Ser 1.26; Potassium 4.9; Sodium 137 03/23/2021: NT-Pro BNP 226  Recent Lipid Panel No results found for: CHOL, TRIG, HDL, CHOLHDL, VLDL, LDLCALC, LDLDIRECT  Physical Exam:    VS:  BP 108/68 (BP Location: Left Arm, Patient Position: Sitting)   Pulse 93   Ht 5\' 5"  (1.651 m)   Wt 148 lb 3.2 oz (67.2 kg)   SpO2 99%   BMI 24.66 kg/m     Wt Readings from Last 3 Encounters:  07/20/21 148 lb 3.2 oz (67.2 kg)  05/30/21 144 lb (65.3 kg)  04/25/21 142 lb (64.4 kg)     GEN:  Well nourished, well developed in no acute distress HEENT: Normal NECK: No JVD; No carotid bruits LYMPHATICS: No lymphadenopathy CARDIAC: RRR, no murmurs, no rubs, no gallops RESPIRATORY:  Clear to auscultation without rales, wheezing or rhonchi  ABDOMEN: Soft, non-tender, non-distended MUSCULOSKELETAL:  No edema; No deformity  SKIN: Warm and dry LOWER EXTREMITIES: no swelling NEUROLOGIC:  Alert and oriented x 3 PSYCHIATRIC:  Normal affect   ASSESSMENT:    1. Coronary artery disease involving  native coronary artery of native heart without angina pectoris   2. Dyslipidemia   3. Pacemaker   4. Poorly controlled diabetes mellitus (HCC)    PLAN:    In order of problems listed above:  Coronary disease stable from that point review on minimal medication as per his wishes.  I had a long discussion with him about this he simply does not want to do it Dyslipidemia he is reluctant to take any medication for it. Pacemaker which is an Abbott device I did review last interrogation which was done in September normal function followed by our EP team. Poorly controlled diabetes likely now he does have continuous blood pressure monitor which helps to directly adjust his medication which he does. Dyspnea on exertion he is telling me that he does not bother him much I wanted to give him small dose of diuretics to see if he responds to  this simple approach however he does not want to do that he said he is fine at least he promised me if things will get worse he will come back to me.   Medication Adjustments/Labs and Tests Ordered: Current medicines are reviewed at length with the patient today.  Concerns regarding medicines are outlined above.  No orders of the defined types were placed in this encounter.  Medication changes: No orders of the defined types were placed in this encounter.   Signed, Georgeanna Lea, MD, [facility] 07/20/2021 3:13 PM    [facility]

## 2021-09-18 LAB — CUP PACEART REMOTE DEVICE CHECK
Battery Remaining Longevity: 50 mo
Battery Remaining Percentage: 66 %
Battery Voltage: 2.98 V
Brady Statistic AP VP Percent: 1.9 %
Brady Statistic AP VS Percent: 96 %
Brady Statistic AS VP Percent: 1 %
Brady Statistic AS VS Percent: 2.4 %
Brady Statistic RA Percent Paced: 95 %
Brady Statistic RV Percent Paced: 1.9 %
Date Time Interrogation Session: 20221129121508
Implantable Lead Implant Date: 20090402
Implantable Lead Implant Date: 20200326
Implantable Lead Location: 753859
Implantable Lead Location: 753860
Implantable Pulse Generator Implant Date: 20200326
Lead Channel Impedance Value: 480 Ohm
Lead Channel Impedance Value: 510 Ohm
Lead Channel Pacing Threshold Amplitude: 0.5 V
Lead Channel Pacing Threshold Amplitude: 1.5 V
Lead Channel Pacing Threshold Pulse Width: 0.5 ms
Lead Channel Pacing Threshold Pulse Width: 0.5 ms
Lead Channel Sensing Intrinsic Amplitude: 2.7 mV
Lead Channel Sensing Intrinsic Amplitude: 5.1 mV
Lead Channel Setting Pacing Amplitude: 2.5 V
Lead Channel Setting Pacing Amplitude: 3.5 V
Lead Channel Setting Pacing Pulse Width: 0.5 ms
Lead Channel Setting Sensing Sensitivity: 0.7 mV
Pulse Gen Model: 2272
Pulse Gen Serial Number: 9120360

## 2021-09-19 ENCOUNTER — Ambulatory Visit (INDEPENDENT_AMBULATORY_CARE_PROVIDER_SITE_OTHER): Payer: 59

## 2021-09-19 DIAGNOSIS — I495 Sick sinus syndrome: Secondary | ICD-10-CM

## 2021-09-28 NOTE — Progress Notes (Signed)
Remote pacemaker transmission.   

## 2021-12-19 ENCOUNTER — Ambulatory Visit (INDEPENDENT_AMBULATORY_CARE_PROVIDER_SITE_OTHER): Payer: 59

## 2021-12-19 DIAGNOSIS — I495 Sick sinus syndrome: Secondary | ICD-10-CM | POA: Diagnosis not present

## 2021-12-20 LAB — CUP PACEART REMOTE DEVICE CHECK
Battery Remaining Longevity: 47 mo
Battery Remaining Percentage: 62 %
Battery Voltage: 2.98 V
Brady Statistic AP VP Percent: 1.8 %
Brady Statistic AP VS Percent: 96 %
Brady Statistic AS VP Percent: 1 %
Brady Statistic AS VS Percent: 2.2 %
Brady Statistic RA Percent Paced: 96 %
Brady Statistic RV Percent Paced: 1.8 %
Date Time Interrogation Session: 20230302111753
Implantable Lead Implant Date: 20090402
Implantable Lead Implant Date: 20200326
Implantable Lead Location: 753859
Implantable Lead Location: 753860
Implantable Pulse Generator Implant Date: 20200326
Lead Channel Impedance Value: 460 Ohm
Lead Channel Impedance Value: 490 Ohm
Lead Channel Pacing Threshold Amplitude: 0.5 V
Lead Channel Pacing Threshold Amplitude: 1.5 V
Lead Channel Pacing Threshold Pulse Width: 0.5 ms
Lead Channel Pacing Threshold Pulse Width: 0.5 ms
Lead Channel Sensing Intrinsic Amplitude: 3.6 mV
Lead Channel Sensing Intrinsic Amplitude: 5.2 mV
Lead Channel Setting Pacing Amplitude: 2.5 V
Lead Channel Setting Pacing Amplitude: 3.5 V
Lead Channel Setting Pacing Pulse Width: 0.5 ms
Lead Channel Setting Sensing Sensitivity: 0.7 mV
Pulse Gen Model: 2272
Pulse Gen Serial Number: 9120360

## 2021-12-27 NOTE — Progress Notes (Signed)
Remote pacemaker transmission.   

## 2022-01-18 ENCOUNTER — Ambulatory Visit: Payer: 59 | Admitting: Cardiology

## 2022-01-30 ENCOUNTER — Ambulatory Visit: Payer: 59 | Admitting: Cardiology

## 2022-01-30 ENCOUNTER — Encounter: Payer: Self-pay | Admitting: Cardiology

## 2022-01-30 VITALS — BP 120/80 | HR 60 | Ht 65.0 in | Wt 145.2 lb

## 2022-01-30 DIAGNOSIS — E785 Hyperlipidemia, unspecified: Secondary | ICD-10-CM | POA: Diagnosis not present

## 2022-01-30 DIAGNOSIS — E1165 Type 2 diabetes mellitus with hyperglycemia: Secondary | ICD-10-CM | POA: Diagnosis not present

## 2022-01-30 DIAGNOSIS — Z950 Presence of cardiac pacemaker: Secondary | ICD-10-CM

## 2022-01-30 DIAGNOSIS — I2510 Atherosclerotic heart disease of native coronary artery without angina pectoris: Secondary | ICD-10-CM | POA: Diagnosis not present

## 2022-01-30 DIAGNOSIS — R0609 Other forms of dyspnea: Secondary | ICD-10-CM

## 2022-01-30 DIAGNOSIS — I495 Sick sinus syndrome: Secondary | ICD-10-CM | POA: Diagnosis not present

## 2022-01-30 NOTE — Patient Instructions (Signed)
Medication Instructions:  ? ?Your physician recommends that you continue on your current medications as directed. Please refer to the Current Medication list given to you today. ? ? ?*If you need a refill on your cardiac medications before your next appointment, please call your pharmacy* ? ? ?Lab Work: NONE ORDERED  TODAY ? ? ?If you have labs (blood work) drawn today and your tests are completely normal, you will receive your results only by: ?MyChart Message (if you have MyChart) OR ?A paper copy in the mail ?If you have any lab test that is abnormal or we need to change your treatment, we will call you to review the results. ? ? ?Testing/Procedures: NONE ORDERED  TODAY ? ? ? ?Follow-Up: ?At [facility], you and your health needs are our priority.  As part of our continuing mission to provide you with exceptional heart care, we have created designated Provider Care Teams.  These Care Teams include your primary Cardiologist (physician) and Advanced Practice Providers (APPs -  Physician Assistants and Nurse Practitioners) who all work together to provide you with the care you need, when you need it. ? ?We recommend signing up for the patient portal called "MyChart".  Sign up information is provided on this After Visit Summary.  MyChart is used to connect with patients for Virtual Visits (Telemedicine).  Patients are able to view lab/test results, encounter notes, upcoming appointments, etc.  Non-urgent messages can be sent to your provider as well.   ?To learn more about what you can do with MyChart, go to [email or web address].   ? ?Your next appointment:   ?6 month(s) ? ?The format for your next appointment:   ?In Person ? ?Provider:   ?Gypsy Balsam, MD{ ? ? ?Other Instructions ? ? ?Important Information About Sugar ? ? ? ? ?  ?

## 2022-01-30 NOTE — Progress Notes (Signed)
?Cardiology Office Note:   ? ?Date:  01/30/2022  ? ?ID:  Terry Joyce, DOB [birth year], MRN [id number] ? ?PCP:  Ralene Ok, MD  ?Cardiologist:  Gypsy Balsam, MD   ? ?Referring MD: Ralene Ok, MD  ? ?Chief Complaint  ?Patient presents with  ? Follow-up  ?I am doing fine ? ?History of Present Illness:   ? ?Terry Joyce is a 71 y.o. male with past medical history significant for coronary artery disease, in 2016 we did have cardiac catheterization which showed only luminal disease.  Also history of dyslipidemia, not treated, diabetes poorly controlled.  He comes today to my office in follow-up overall he seems to be doing well but he does not take aspirin he does not take any cholesterol medication.  Denies have any chest pain tightness squeezing pressure burning chest described to have some episode of fatigue and weakness still working a lot in the garden he got some great plans and he is taking care of those as he said sometimes when he work hard he will be very tired.  But otherwise no cardiac complaints. ? ?Past Medical History:  ?Diagnosis Date  ? Atypical chest pain 12/10/2018  ? Bradycardia 06/29/2015  ? Coronary artery disease involving native coronary artery of native heart without angina pectoris 07/13/2015  ? 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 Dyslipidemia 06/29/2015  ? Dyspnea on exertion 12/20/2020  ? Exertional chest pain 06/29/2015  ? Normal coronary arteries 04/30/2018  ? Pacemaker 12/10/2018  ? Pacemaker reprogramming/check 06/29/2015  ? Poorly controlled diabetes mellitus (HCC) 06/29/2015  ? Pre-syncope 10/26/2017  ? Tachycardia 10/26/2017  ? Umbilical hernia without obstruction and without gangrene 12/14/2019  ? ? ?Past Surgical History:  ?Procedure Laterality Date  ? CARDIAC CATHETERIZATION Left 07/04/2015  ? Surgeon: Derrek Gu, MD; Location: [facility] CATH; Service: Cardiology  ? CARDIAC PACEMAKER PLACEMENT    ? LEAD INSERTION Left 01/14/2019  ? Procedure: LEAD  INSERTION;  Surgeon: Regan Lemming, MD;  Location: [facility] INVAS[facility] CV LAB;  Service: Cardiovascular;  Laterality: Left;  ? PPM GENERATOR CHANGEOUT N/A 01/14/2019  ? Procedure: PPM GENERATOR CHANGEOUT;  Surgeon: Regan Lemming, MD;  Location: [facility]VAS[facility] CV LAB;  Service: Cardiovascular;  Laterality: N/A;  ? ? ?Current Medications: ?Current Meds  ?Medication Sig  ? albuterol (VENTOLIN HFA) 108 (90 Base) MCG/ACT inhaler Inhale 2 puffs into the lungs every 6 (six) hours as needed for wheezing or shortness of breath.  ? EPINEPHrine (EPIPEN JR) 0.15 MG/0.3ML injection Inject 0.15 mg into the muscle as needed for anaphylaxis.   ? FIASP FLEXTOUCH 100 UNIT/ML SOPN Take 8-10 Units by mouth as needed (When glucose is elevated or low).  ? insulin degludec (TRESIBA) 100 UNIT/ML SOPN FlexTouch Pen Inject 45 Units into the skin as needed (when glucose either elevated or low).  ? Semaglutide (OZEMPIC, 0.25 OR 0.5 MG/DOSE, [place]) Inject 0.25 mg into the skin daily. Patient is not aware of strength  ? tamsulosin (FLOMAX) 0.4 MG CAPS capsule Take 1 capsule by mouth daily as needed (prostate).  ? umeclidinium-vilanterol (ANORO ELLIPTA) 62.5-25 MCG/INH AEPB Inhale 1 puff into the lungs daily.  ?  ? ?Allergies:   Penicillins  ? ?Social History  ? ?Socioeconomic History  ? Marital status: Single  ?  Spouse name: Not on file  ? Number of children: Not on file  ? Years of education: Not on file  ? Highest education level: Not on file  ?Occupational History  ? Not  on file  ?Tobacco Use  ? Smoking status: Never  ? Smokeless tobacco: Never  ?Vaping Use  ? Vaping Use: Never used  ?Substance and Sexual Activity  ? Alcohol use: Yes  ? Drug use: Not Currently  ? Sexual activity: Not on file  ?Other Topics Concern  ? Not on file  ?Social History Narrative  ? Not on file  ? ?Social Determinants of Health  ? ?Financial Resource Strain: Not on file  ?Food Insecurity: Not on file  ?Transportation Needs: Not on file  ?Physical Activity: Not on  file  ?Stress: Not on file  ?Social Connections: Not on file  ?  ? ?Family History: ?The patient's family history includes Colon cancer in his father; Diabetes in his mother. ?ROS:   ?Please see the history of present illness.    ?All 14 point review of systems negative except as described per history of present illness ? ?EKGs/Labs/Other Studies Reviewed:   ? ? ? ?Recent Labs: ?03/23/2021: NT-Pro BNP 226  ?Recent Lipid Panel ?No results found for: CHOL, TRIG, HDL, CHOLHDL, VLDL, LDLCALC, LDLDIRECT ? ?Physical Exam:   ? ?VS:  BP 120/80 (BP Location: Left Arm, Patient Position: Sitting)   Pulse 60   Ht 5\' 5"  (1.651 m)   Wt 145 lb 3.2 oz (65.9 kg)   SpO2 99%   BMI 24.16 kg/m?    ? ?Wt Readings from Last 3 Encounters:  ?01/30/22 145 lb 3.2 oz (65.9 kg)  ?07/20/21 148 lb 3.2 oz (67.2 kg)  ?05/30/21 144 lb (65.3 kg)  ?  ? ?GEN:  Well nourished, well developed in no acute distress ?HEENT: Normal ?NECK: No JVD; No carotid bruits ?LYMPHATICS: No lymphadenopathy ?CARDIAC: RRR, no murmurs, no rubs, no gallops ?RESPIRATORY:  Clear to auscultation without rales, wheezing or rhonchi  ?ABDOMEN: Soft, non-tender, non-distended ?MUSCULOSKELETAL:  No edema; No deformity  ?SKIN: Warm and dry ?LOWER EXTREMITIES: no swelling ?NEUROLOGIC:  Alert and oriented x 3 ?PSYCHIATRIC:  Normal affect  ? ?ASSESSMENT:   ? ?1. Sick sinus syndrome (HCC)   ?2. Coronary artery disease involving native coronary artery of native heart without angina pectoris   ?3. Poorly controlled diabetes mellitus (HCC)   ?4. Dyslipidemia   ?5. Dyspnea on exertion   ?6. Pacemaker   ? ?PLAN:   ? ?In order of problems listed above: ? ?Sick sinus syndrome that being addressed with pacemaker.  Pacemaker functioning normally.  It is an Abbott device last check in December 20, 2021 showing normal battery status 3.9 years left in the device. ?Coronary artery disease frustrating situation if he does not want to take aspirin.  I was trying to convince him hopefully he start  taking baby aspirin every single day. ?Dyslipidemia also very frustrating scenario still does not want to take any medications he does not rule remedies to help with that. ?Dyspnea on exertion: Denies having any. ? ? ?Medication Adjustments/Labs and Tests Ordered: ?Current medicines are reviewed at length with the patient today.  Concerns regarding medicines are outlined above.  ?Orders Placed This Encounter  ?Procedures  ? EKG 12-Lead  ? ?Medication changes: No orders of the defined types were placed in this encounter. ? ? ?Signed, ?December 22, 2021, MD, [facility] ?01/30/2022 9:07 AM    ?04[facility]01/[facility] ? ?

## 2022-03-20 ENCOUNTER — Ambulatory Visit (INDEPENDENT_AMBULATORY_CARE_PROVIDER_SITE_OTHER): Payer: 59

## 2022-03-20 DIAGNOSIS — I495 Sick sinus syndrome: Secondary | ICD-10-CM | POA: Diagnosis not present

## 2022-03-21 LAB — CUP PACEART REMOTE DEVICE CHECK
Battery Remaining Longevity: 44 mo
Battery Remaining Percentage: 59 %
Battery Voltage: 2.98 V
Brady Statistic AP VP Percent: 1.8 %
Brady Statistic AP VS Percent: 96 %
Brady Statistic AS VP Percent: 1 %
Brady Statistic AS VS Percent: 2.3 %
Brady Statistic RA Percent Paced: 96 %
Brady Statistic RV Percent Paced: 1.8 %
Date Time Interrogation Session: 20230531055420
Implantable Lead Implant Date: 20090402
Implantable Lead Implant Date: 20200326
Implantable Lead Location: 753859
Implantable Lead Location: 753860
Implantable Pulse Generator Implant Date: 20200326
Lead Channel Impedance Value: 450 Ohm
Lead Channel Impedance Value: 490 Ohm
Lead Channel Pacing Threshold Amplitude: 0.5 V
Lead Channel Pacing Threshold Amplitude: 1.5 V
Lead Channel Pacing Threshold Pulse Width: 0.5 ms
Lead Channel Pacing Threshold Pulse Width: 0.5 ms
Lead Channel Sensing Intrinsic Amplitude: 4.4 mV
Lead Channel Sensing Intrinsic Amplitude: 4.6 mV
Lead Channel Setting Pacing Amplitude: 2.5 V
Lead Channel Setting Pacing Amplitude: 3.5 V
Lead Channel Setting Pacing Pulse Width: 0.5 ms
Lead Channel Setting Sensing Sensitivity: 0.7 mV
Pulse Gen Model: 2272
Pulse Gen Serial Number: 9120360

## 2022-04-03 NOTE — Progress Notes (Signed)
Remote pacemaker transmission.   

## 2022-05-27 IMAGING — DX DG CHEST 2V
2 series · 2 of 2 positions shown · non-contrast
Comparison: 03/23/2019.

CLINICAL DATA: Shortness of breath.

EXAM:
CHEST - 2 VIEW

[chest pa]
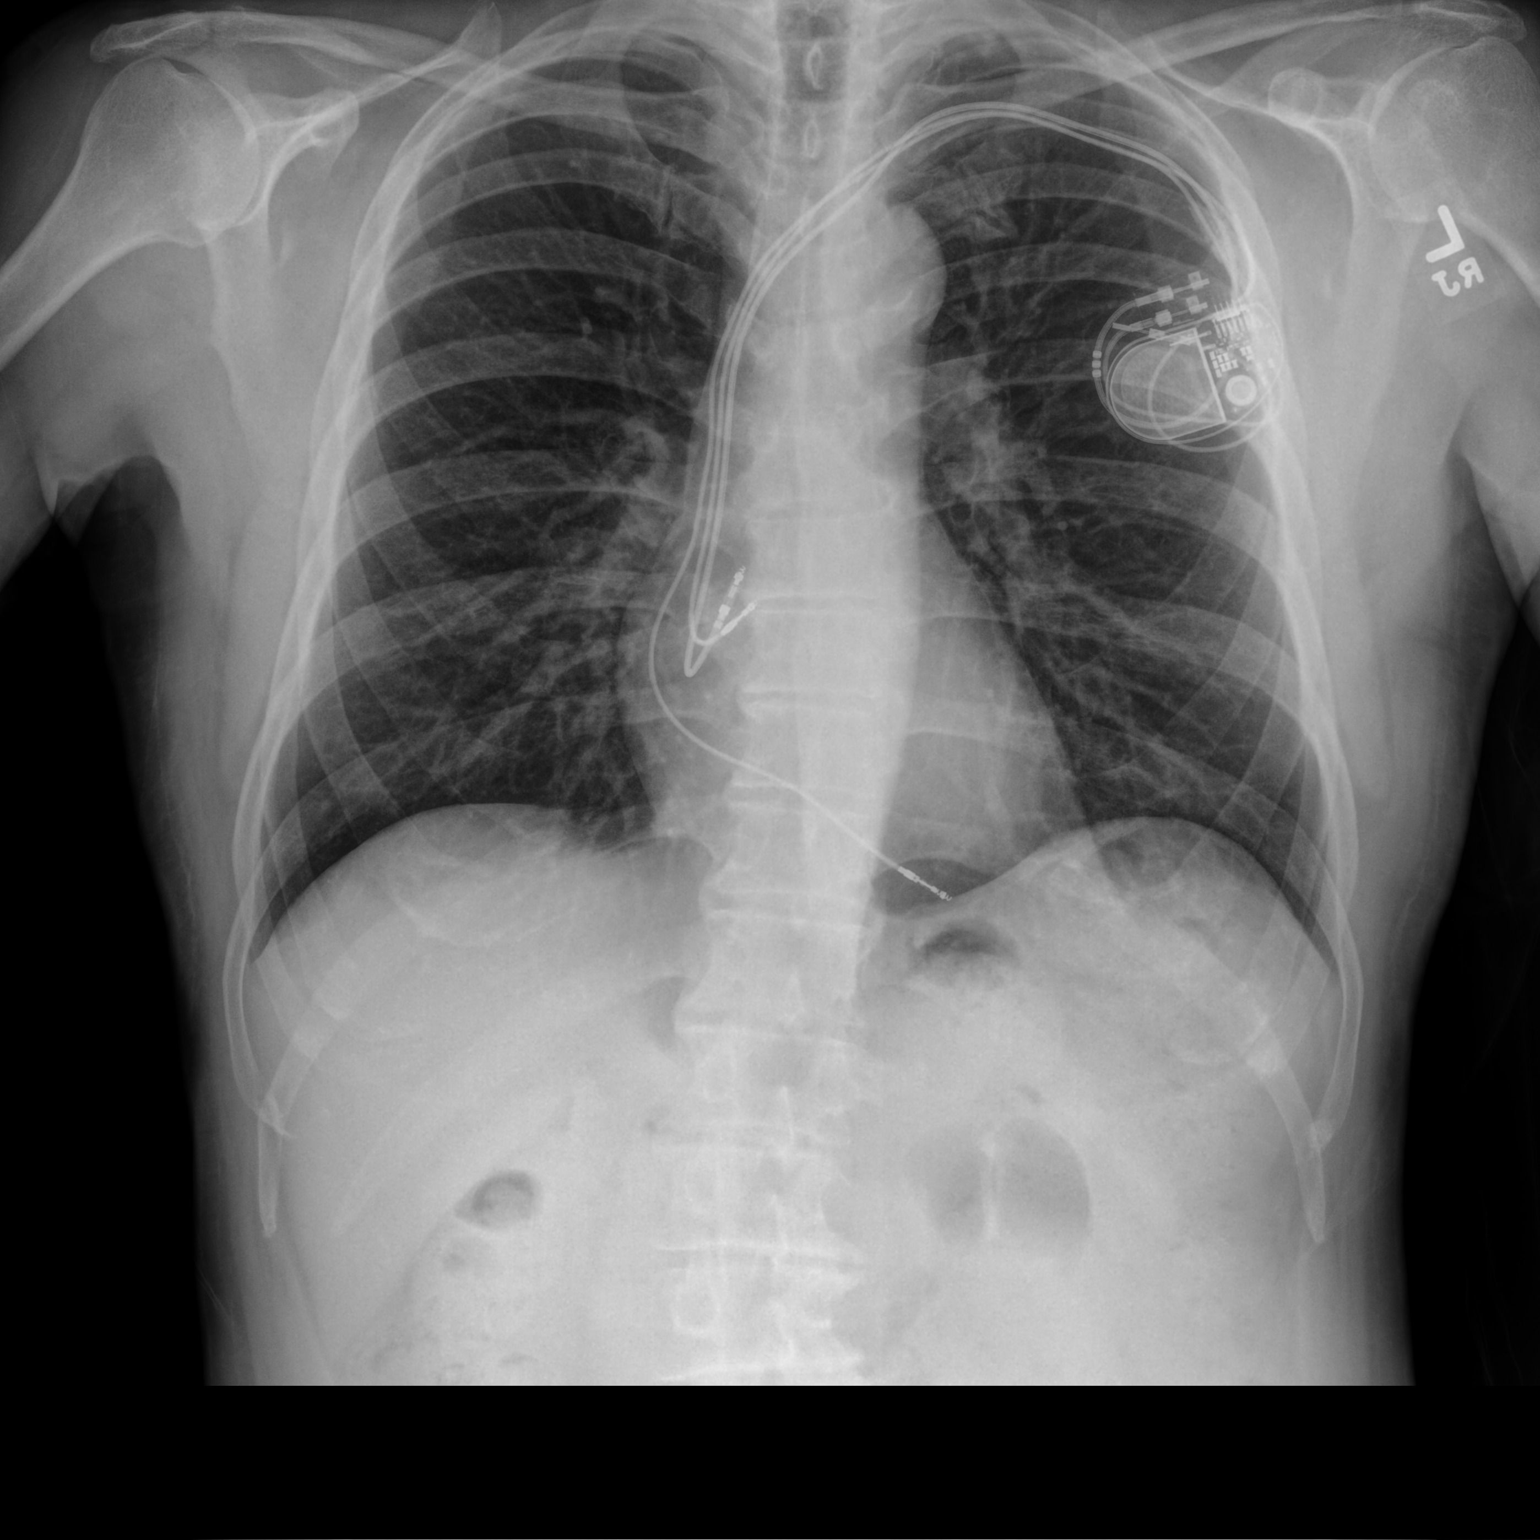

[chest lat]
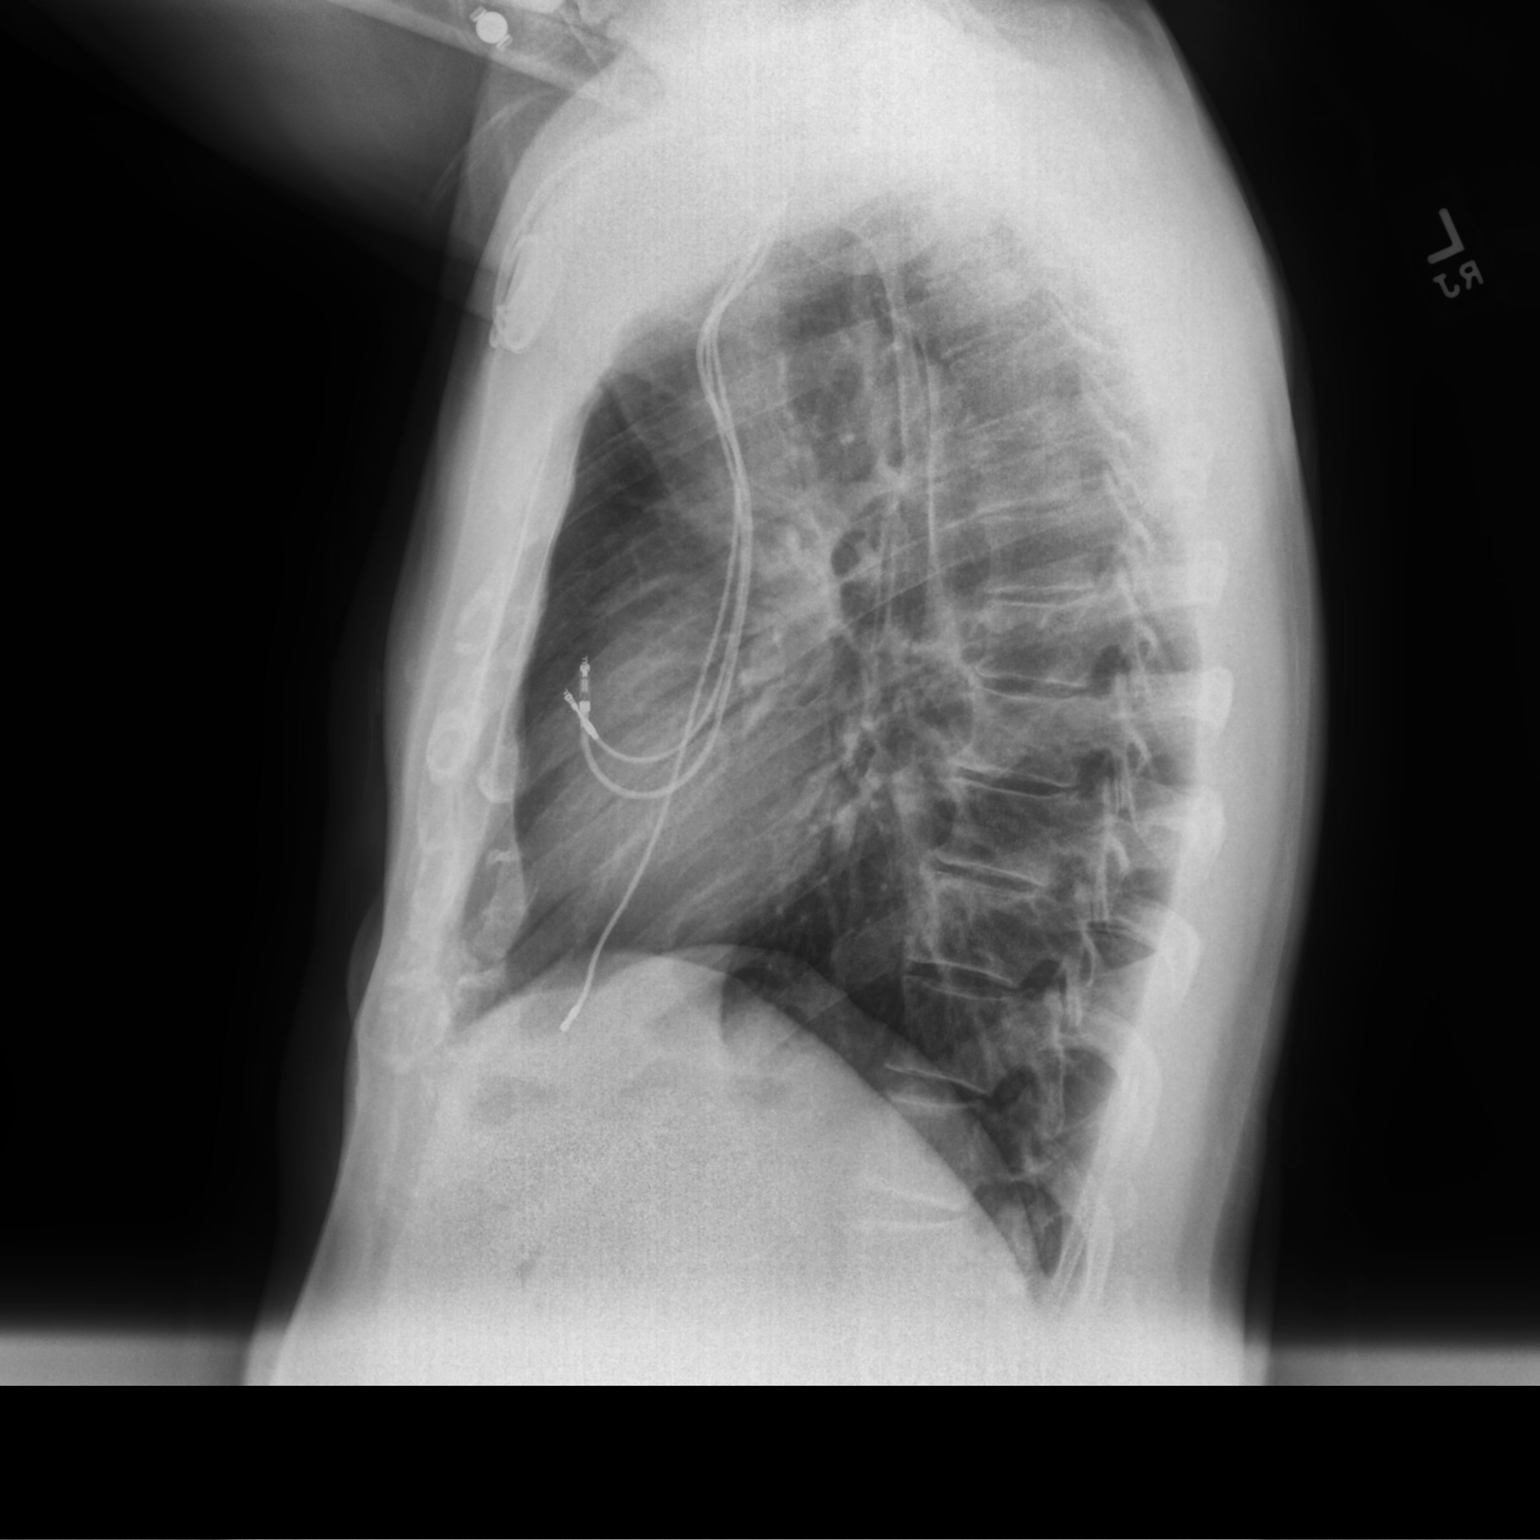

[2 of 2 positions shown; findings below may reference images not displayed]

FINDINGS: Cardiac pacer in stable position. Heart size normal. No focal
infiltrate. No pleural effusion or pneumothorax. Degenerative
changes scoliosis thoracic spine.
IMPRESSION: 1.  Cardiac pacer stable position.  Heart size normal.

2.  No acute pulmonary disease.

## 2022-06-19 ENCOUNTER — Ambulatory Visit (INDEPENDENT_AMBULATORY_CARE_PROVIDER_SITE_OTHER): Payer: 59

## 2022-06-19 DIAGNOSIS — I495 Sick sinus syndrome: Secondary | ICD-10-CM | POA: Diagnosis not present

## 2022-06-19 LAB — CUP PACEART REMOTE DEVICE CHECK
Battery Remaining Longevity: 41 mo
Battery Remaining Percentage: 55 %
Battery Voltage: 2.98 V
Brady Statistic AP VP Percent: 1.8 %
Brady Statistic AP VS Percent: 96 %
Brady Statistic AS VP Percent: 1 %
Brady Statistic AS VS Percent: 2.3 %
Brady Statistic RA Percent Paced: 96 %
Brady Statistic RV Percent Paced: 1.8 %
Date Time Interrogation Session: 20230830020014
Implantable Lead Implant Date: 20090402
Implantable Lead Implant Date: 20200326
Implantable Lead Location: 753859
Implantable Lead Location: 753860
Implantable Pulse Generator Implant Date: 20200326
Lead Channel Impedance Value: 450 Ohm
Lead Channel Impedance Value: 460 Ohm
Lead Channel Pacing Threshold Amplitude: 0.5 V
Lead Channel Pacing Threshold Amplitude: 1.5 V
Lead Channel Pacing Threshold Pulse Width: 0.5 ms
Lead Channel Pacing Threshold Pulse Width: 0.5 ms
Lead Channel Sensing Intrinsic Amplitude: 4.7 mV
Lead Channel Sensing Intrinsic Amplitude: 4.9 mV
Lead Channel Setting Pacing Amplitude: 2.5 V
Lead Channel Setting Pacing Amplitude: 3.5 V
Lead Channel Setting Pacing Pulse Width: 0.5 ms
Lead Channel Setting Sensing Sensitivity: 0.7 mV
Pulse Gen Model: 2272
Pulse Gen Serial Number: 9120360

## 2022-07-12 NOTE — Progress Notes (Signed)
Remote pacemaker transmission.   

## 2022-09-18 ENCOUNTER — Ambulatory Visit (INDEPENDENT_AMBULATORY_CARE_PROVIDER_SITE_OTHER): Payer: 59

## 2022-09-18 DIAGNOSIS — I495 Sick sinus syndrome: Secondary | ICD-10-CM

## 2022-09-18 LAB — CUP PACEART REMOTE DEVICE CHECK
Battery Remaining Longevity: 38 mo
Battery Remaining Percentage: 51 %
Battery Voltage: 2.98 V
Brady Statistic AP VP Percent: 1.7 %
Brady Statistic AP VS Percent: 96 %
Brady Statistic AS VP Percent: 1 %
Brady Statistic AS VS Percent: 2.1 %
Brady Statistic RA Percent Paced: 96 %
Brady Statistic RV Percent Paced: 1.8 %
Date Time Interrogation Session: 20231129020012
Implantable Lead Connection Status: 753985
Implantable Lead Connection Status: 753985
Implantable Lead Implant Date: 20090402
Implantable Lead Implant Date: 20200326
Implantable Lead Location: 753859
Implantable Lead Location: 753860
Implantable Pulse Generator Implant Date: 20200326
Lead Channel Impedance Value: 450 Ohm
Lead Channel Impedance Value: 480 Ohm
Lead Channel Pacing Threshold Amplitude: 0.5 V
Lead Channel Pacing Threshold Amplitude: 1.5 V
Lead Channel Pacing Threshold Pulse Width: 0.5 ms
Lead Channel Pacing Threshold Pulse Width: 0.5 ms
Lead Channel Sensing Intrinsic Amplitude: 3.1 mV
Lead Channel Sensing Intrinsic Amplitude: 4.5 mV
Lead Channel Setting Pacing Amplitude: 2.5 V
Lead Channel Setting Pacing Amplitude: 3.5 V
Lead Channel Setting Pacing Pulse Width: 0.5 ms
Lead Channel Setting Sensing Sensitivity: 0.7 mV
Pulse Gen Model: 2272
Pulse Gen Serial Number: 9120360

## 2022-10-16 NOTE — Progress Notes (Signed)
Remote pacemaker transmission.   

## 2022-10-18 ENCOUNTER — Ambulatory Visit: Payer: 59 | Attending: Cardiology | Admitting: Cardiology

## 2022-10-18 ENCOUNTER — Encounter: Payer: Self-pay | Admitting: Cardiology

## 2022-10-18 VITALS — BP 110/70 | HR 62 | Ht 65.0 in | Wt 145.0 lb

## 2022-10-18 DIAGNOSIS — I2510 Atherosclerotic heart disease of native coronary artery without angina pectoris: Secondary | ICD-10-CM

## 2022-10-18 DIAGNOSIS — E1165 Type 2 diabetes mellitus with hyperglycemia: Secondary | ICD-10-CM

## 2022-10-18 DIAGNOSIS — E785 Hyperlipidemia, unspecified: Secondary | ICD-10-CM | POA: Diagnosis not present

## 2022-10-18 DIAGNOSIS — Z950 Presence of cardiac pacemaker: Secondary | ICD-10-CM | POA: Diagnosis not present

## 2022-10-18 NOTE — Patient Instructions (Addendum)
Medication Instructions:  ?Your physician recommends that you continue on your current medications as directed. Please refer to the Current Medication list given to you today.  ?*If you need a refill on your cardiac medications before your next appointment, please call your pharmacy* ? ? ?Lab Work: ?LDL Direct Today ?If you have labs (blood work) drawn today and your tests are completely normal, you will receive your results only by: ?MyChart Message (if you have MyChart) OR ?A paper copy in the mail ?If you have any lab test that is abnormal or we need to change your treatment, we will call you to review the results. ? ? ?Testing/Procedures: ?None Ordered ? ? ?Follow-Up: ?At [facility], you and your health needs are our priority.  As part of our continuing mission to provide you with exceptional heart care, we have created designated Provider Care Teams.  These Care Teams include your primary Cardiologist (physician) and Advanced Practice Providers (APPs -  Physician Assistants and Nurse Practitioners) who all work together to provide you with the care you need, when you need it. ? ?We recommend signing up for the patient portal called "MyChart".  Sign up information is provided on this After Visit Summary.  MyChart is used to connect with patients for Virtual Visits (Telemedicine).  Patients are able to view lab/test results, encounter notes, upcoming appointments, etc.  Non-urgent messages can be sent to your provider as well.   ?To learn more about what you can do with MyChart, go to [email or web address].   ? ?Your next appointment:   ?6 month(s) ? ?The format for your next appointment:   ?In Person ? ?Provider:   ?Robert Krasowski, MD  ? ? ?Other Instructions ?NA  ?

## 2022-10-18 NOTE — Progress Notes (Signed)
Cardiology Office Note:    Date:  10/18/2022   ID:  Terry Joyce, DOB Jun 08, 1951, MRN [id number]  PCP:  Ralene Ok, MD  Cardiologist:  Gypsy Balsam, MD    Referring MD: Ralene Ok, MD   Chief Complaint  Patient presents with   Follow-up    History of Present Illness:    Terry Joyce is a 71 y.o. male who is a father in [facility], past medical history significant for luminal disease based on cardiac catheterization from 2016, diabetes which is poorly controlled, essential hypertension, dyslipidemia with noncompliance with medication.  He comes today to months for follow-up overall he says he is doing well he jokes like always a lot mild I have to admit that he is simply pleasantly noncompliant.  Denies have any chest pain tightness squeezing pressure burning chest no shortness of breath.  Past Medical History:  Diagnosis Date   Atypical chest pain 12/10/2018   Bradycardia 06/29/2015   Coronary artery disease involving native coronary artery of native heart without angina pectoris 07/13/2015   Overview:  Luminal disease by cardiac catheter from September 2016   Dyslipidemia 06/29/2015   Dyspnea on exertion 12/20/2020   Exertional chest pain 06/29/2015   Normal coronary arteries 04/30/2018   Pacemaker 12/10/2018   Pacemaker reprogramming/check 06/29/2015   Poorly controlled diabetes mellitus (HCC) 06/29/2015   Pre-syncope 10/26/2017   Tachycardia 10/26/2017   Umbilical hernia without obstruction and without gangrene 12/14/2019    Past Surgical History:  Procedure Laterality Date   CARDIAC CATHETERIZATION Left 07/04/2015   Surgeon: Derrek Gu, MD; Location: [facility] CATH; Service: Cardiology   CARDIAC PACEMAKER PLACEMENT     LEAD INSERTION Left 01/14/2019   Procedure: LEAD INSERTION;  Surgeon: Regan Lemming, MD;  [facility];  Service: Cardiovascular;  Laterality: Left;   PPM GENERATOR CHANGEOUT N/A 01/14/2019   Procedure: PPM GENERATOR  CHANGEOUT;  Surgeon: Regan Lemming, MD;  Location: [facility] LAB;  Service: Cardiovascular;  Laterality: N/A;    Current Medications: Current Meds  Medication Sig   albuterol (VENTOLIN HFA) 108 (90 Base) MCG/ACT inhaler Inhale 2 puffs into the lungs every 6 (six) hours as needed for wheezing or shortness of breath.   EPINEPHrine (EPIPEN JR) 0.15 MG/0.3ML injection Inject 0.15 mg into the muscle as needed for anaphylaxis.    FIASP FLEXTOUCH 100 UNIT/ML SOPN Take 8-10 Units by mouth as needed (When glucose is elevated or low).   insulin degludec (TRESIBA) 100 UNIT/ML SOPN FlexTouch Pen Inject 45 Units into the skin as needed (when glucose either elevated or low).   Semaglutide (OZEMPIC, 0.25 OR 0.5 MG/DOSE, [place]) Inject 0.25 mg into the skin daily. Patient is not aware of strength   tamsulosin (FLOMAX) 0.4 MG CAPS capsule Take 1 capsule by mouth daily as needed (prostate).     Allergies:   Penicillins   Social History   Socioeconomic History   Marital status: Single    Spouse name: Not on file   Number of children: Not on file   Years of education: Not on file   Highest education level: Not on file  Occupational History   Not on file  Tobacco Use   Smoking status: Never   Smokeless tobacco: Never  Vaping Use   Vaping Use: Never used  Substance and Sexual Activity   Alcohol use: Yes   Drug use: Not Currently   Sexual activity: Not on file  Other Topics Concern   Not on file  Social History Narrative   Not on file   Social Determinants of Health   Financial Resource Strain: Not on file  Food Insecurity: Not on file  Transportation Needs: Not on file  Physical Activity: Not on file  Stress: Not on file  Social Connections: Not on file     Family History: The patient's family history includes Colon cancer in his father; Diabetes in his mother. ROS:   Please see the history of present illness.    All 14 point review of systems negative except as described per  history of present illness  EKGs/Labs/Other Studies Reviewed:      Recent Labs: No results found for requested labs within last 365 days.  Recent Lipid Panel No results found for: "CHOL", "TRIG", "HDL", "CHOLHDL", "VLDL", "LDLCALC", "LDLDIRECT"  Physical Exam:    VS:  BP 110/70 (BP Location: Left Arm, Patient Position: Sitting, Cuff Size: Normal)   Pulse 62   Ht 5\' 5"  (1.651 m)   Wt 145 lb (65.8 kg)   SpO2 99%   BMI 24.13 kg/m     Wt Readings from Last 3 Encounters:  10/18/22 145 lb (65.8 kg)  01/30/22 145 lb 3.2 oz (65.9 kg)  07/20/21 148 lb 3.2 oz (67.2 kg)     GEN:  Well nourished, well developed in no acute distress HEENT: Normal NECK: No JVD; No carotid bruits LYMPHATICS: No lymphadenopathy CARDIAC: RRR, no murmurs, no rubs, no gallops RESPIRATORY:  Clear to auscultation without rales, wheezing or rhonchi  ABDOMEN: Soft, non-tender, non-distended MUSCULOSKELETAL:  No edema; No deformity  SKIN: Warm and dry LOWER EXTREMITIES: no swelling NEUROLOGIC:  Alert and oriented x 3 PSYCHIATRIC:  Normal affect   ASSESSMENT:    1. Dyslipidemia   2. Coronary artery disease involving native coronary artery of native heart without angina pectoris   3. Poorly controlled diabetes mellitus (HCC)   4. Pacemaker    PLAN:    In order of problems listed above:  Dyslipidemia I will for him fasting lipid profile however he declined.  He takes muscadine wine and he is at this can help him with his dyslipidemia. Coronary artery disease likely he is asymptomatic. Poorly controlled diabetes.  I do have his hemoglobin A1c which is 9.0 I told him it is acceptably high, but he tells me that he is fine. Pacemaker present, sent that showed the device, 3.2 years left in the device followed by our device clinic   Medication Adjustments/Labs and Tests Ordered: Current medicines are reviewed at length with the patient today.  Concerns regarding medicines are outlined above.  Orders Placed  This Encounter  Procedures   LDL cholesterol, direct   Medication changes: No orders of the defined types were placed in this encounter.   Signed, 07/22/21, MD, [facility] 10/18/2022 5:50 PM    [facility]

## 2022-10-19 LAB — LDL CHOLESTEROL, DIRECT: LDL Direct: 127 mg/dL — ABNORMAL HIGH (ref 0–99)

## 2022-10-31 ENCOUNTER — Telehealth: Payer: Self-pay

## 2022-10-31 NOTE — Telephone Encounter (Signed)
Patient notified of results and recommendations, he will call when ready to proceed.

## 2022-10-31 NOTE — Telephone Encounter (Signed)
-----   Message from Park Liter, MD sent at 10/22/2022 12:46 PM EST ----- Cholesterol is elevated, ideally need to start taking cholesterol medication Lipitor 10 will be good way to go but as far as I know him he probably can refuse he is using muscadine wine  For treatment of his cholesterol

## 2022-10-31 NOTE — Telephone Encounter (Signed)
I called and left a message and sent a message through my chart, I will wait for the patient to return my call.

## 2022-12-18 ENCOUNTER — Ambulatory Visit: Payer: 59

## 2022-12-18 DIAGNOSIS — I495 Sick sinus syndrome: Secondary | ICD-10-CM | POA: Diagnosis not present

## 2022-12-18 LAB — CUP PACEART REMOTE DEVICE CHECK
Battery Remaining Longevity: 35 mo
Battery Remaining Percentage: 48 %
Battery Voltage: 2.98 V
Brady Statistic AP VP Percent: 1.7 %
Brady Statistic AP VS Percent: 96 %
Brady Statistic AS VP Percent: 1 %
Brady Statistic AS VS Percent: 2.1 %
Brady Statistic RA Percent Paced: 96 %
Brady Statistic RV Percent Paced: 1.7 %
Date Time Interrogation Session: 20240228020013
Implantable Lead Connection Status: 753985
Implantable Lead Connection Status: 753985
Implantable Lead Implant Date: 20090402
Implantable Lead Implant Date: 20200326
Implantable Lead Location: 753859
Implantable Lead Location: 753860
Implantable Pulse Generator Implant Date: 20200326
Lead Channel Impedance Value: 440 Ohm
Lead Channel Impedance Value: 480 Ohm
Lead Channel Pacing Threshold Amplitude: 0.5 V
Lead Channel Pacing Threshold Amplitude: 1.5 V
Lead Channel Pacing Threshold Pulse Width: 0.5 ms
Lead Channel Pacing Threshold Pulse Width: 0.5 ms
Lead Channel Sensing Intrinsic Amplitude: 3.1 mV
Lead Channel Sensing Intrinsic Amplitude: 5.4 mV
Lead Channel Setting Pacing Amplitude: 2.5 V
Lead Channel Setting Pacing Amplitude: 3.5 V
Lead Channel Setting Pacing Pulse Width: 0.5 ms
Lead Channel Setting Sensing Sensitivity: 0.7 mV
Pulse Gen Model: 2272
Pulse Gen Serial Number: 9120360

## 2023-01-22 NOTE — Progress Notes (Signed)
Remote pacemaker transmission.   

## 2023-02-07 ENCOUNTER — Emergency Department: Payer: 59

## 2023-02-07 ENCOUNTER — Emergency Department
Admission: EM | Admit: 2023-02-07 | Discharge: 2023-02-07 | Disposition: A | Discharge status: Home / Self Care | Payer: 59 | Attending: Emergency Medicine | Admitting: Emergency Medicine

## 2023-02-07 ENCOUNTER — Other Ambulatory Visit: Payer: Self-pay

## 2023-02-07 DIAGNOSIS — S3991XA Unspecified injury of abdomen, initial encounter: Secondary | ICD-10-CM | POA: Insufficient documentation

## 2023-02-07 DIAGNOSIS — Z23 Encounter for immunization: Secondary | ICD-10-CM | POA: Insufficient documentation

## 2023-02-07 DIAGNOSIS — I2510 Atherosclerotic heart disease of native coronary artery without angina pectoris: Secondary | ICD-10-CM | POA: Insufficient documentation

## 2023-02-07 DIAGNOSIS — E119 Type 2 diabetes mellitus without complications: Secondary | ICD-10-CM | POA: Diagnosis not present

## 2023-02-07 DIAGNOSIS — S0990XA Unspecified injury of head, initial encounter: Secondary | ICD-10-CM | POA: Insufficient documentation

## 2023-02-07 DIAGNOSIS — S42035A Nondisplaced fracture of lateral end of left clavicle, initial encounter for closed fracture: Secondary | ICD-10-CM | POA: Insufficient documentation

## 2023-02-07 DIAGNOSIS — S42002A Fracture of unspecified part of left clavicle, initial encounter for closed fracture: Secondary | ICD-10-CM

## 2023-02-07 DIAGNOSIS — S299XXA Unspecified injury of thorax, initial encounter: Secondary | ICD-10-CM | POA: Insufficient documentation

## 2023-02-07 DIAGNOSIS — M25512 Pain in left shoulder: Secondary | ICD-10-CM | POA: Diagnosis not present

## 2023-02-07 DIAGNOSIS — Z950 Presence of cardiac pacemaker: Secondary | ICD-10-CM | POA: Diagnosis not present

## 2023-02-07 DIAGNOSIS — S4992XA Unspecified injury of left shoulder and upper arm, initial encounter: Secondary | ICD-10-CM | POA: Diagnosis present

## 2023-02-07 DIAGNOSIS — Z794 Long term (current) use of insulin: Secondary | ICD-10-CM | POA: Insufficient documentation

## 2023-02-07 DIAGNOSIS — Y92410 Unspecified street and highway as the place of occurrence of the external cause: Secondary | ICD-10-CM | POA: Diagnosis not present

## 2023-02-07 DIAGNOSIS — R911 Solitary pulmonary nodule: Secondary | ICD-10-CM

## 2023-02-07 LAB — CBC
HCT: 42.4 % (ref 39.0–52.0)
Hemoglobin: 13.9 g/dL (ref 13.0–17.0)
MCH: 29.3 pg (ref 26.0–34.0)
MCHC: 32.8 g/dL (ref 30.0–36.0)
MCV: 89.3 fL (ref 80.0–100.0)
Platelets: 266 10*3/uL (ref 150–400)
RBC: 4.75 MIL/uL (ref 4.22–5.81)
RDW: 12.7 % (ref 11.5–15.5)
WBC: 9.4 10*3/uL (ref 4.0–10.5)
nRBC: 0 % (ref 0.0–0.2)

## 2023-02-07 LAB — COMPREHENSIVE METABOLIC PANEL
ALT: 16 U/L (ref 0–44)
AST: 25 U/L (ref 15–41)
Albumin: 4.3 g/dL (ref 3.5–5.0)
Alkaline Phosphatase: 62 U/L (ref 38–126)
Anion gap: 9 (ref 5–15)
BUN: 29 mg/dL — ABNORMAL HIGH (ref 8–23)
CO2: 24 mmol/L (ref 22–32)
Calcium: 9.2 mg/dL (ref 8.9–10.3)
Chloride: 102 mmol/L (ref 98–111)
Creatinine, Ser: 1.32 mg/dL — ABNORMAL HIGH (ref 0.61–1.24)
GFR, Estimated: 57 mL/min — ABNORMAL LOW (ref >60)
Glucose, Bld: 305 mg/dL — ABNORMAL HIGH (ref 70–99)
Potassium: 4.4 mmol/L (ref 3.5–5.1)
Sodium: 135 mmol/L (ref 135–145)
Total Bilirubin: 1.2 mg/dL (ref 0.3–1.2)
Total Protein: 7.4 g/dL (ref 6.5–8.1)

## 2023-02-07 LAB — ETHANOL: Alcohol, Ethyl (B): <10 mg/dL (ref <10)

## 2023-02-07 LAB — PROTIME-INR
INR: 1.1 (ref 0.8–1.2)
Prothrombin Time: 13.8 seconds (ref 11.4–15.2)

## 2023-02-07 MED ORDER — TETANUS-DIPHTH-ACELL PERTUSSIS 5-2.5-18.5 LF-MCG/0.5 IM SUSY
0.5000 mL | PREFILLED_SYRINGE | Freq: Once | INTRAMUSCULAR | Status: AC
  Administered 2023-02-07: 0.5 mL via INTRAMUSCULAR
  Filled 2023-02-07: qty 0.5

## 2023-02-07 MED ORDER — SODIUM CHLORIDE 0.9 % IV BOLUS
500.0000 mL | Freq: Once | INTRAVENOUS | Status: AC
  Administered 2023-02-07: 500 mL via INTRAVENOUS

## 2023-02-07 MED ORDER — IOHEXOL 300 MG/ML  SOLN
100.0000 mL | Freq: Once | INTRAMUSCULAR | Status: AC | PRN
  Administered 2023-02-07: 100 mL via INTRAVENOUS

## 2023-02-07 NOTE — Discharge Instructions (Addendum)
You may take over-the-counter Tylenol 1000 mg every 6 hours as needed for pain.  I recommend you avoid aspirin, ibuprofen, Aleve given you do have slightly elevated kidney function which appears to be your baseline.  Your blood sugar was also elevated today in the 300s.  Please make sure you are taking your diabetes medications as prescribed.
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
## 2023-02-11 ENCOUNTER — Other Ambulatory Visit (INDEPENDENT_AMBULATORY_CARE_PROVIDER_SITE_OTHER): Payer: 59

## 2023-02-11 ENCOUNTER — Encounter: Payer: Self-pay | Admitting: Orthopaedic Surgery

## 2023-02-11 ENCOUNTER — Ambulatory Visit: Payer: 59 | Admitting: Orthopaedic Surgery

## 2023-02-11 DIAGNOSIS — S42035A Nondisplaced fracture of lateral end of left clavicle, initial encounter for closed fracture: Secondary | ICD-10-CM
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
## 2023-02-17 ENCOUNTER — Ambulatory Visit: Payer: 59 | Attending: Cardiology

## 2023-02-17 ENCOUNTER — Telehealth: Payer: Self-pay

## 2023-02-17 DIAGNOSIS — I495 Sick sinus syndrome: Secondary | ICD-10-CM | POA: Diagnosis not present

## 2023-02-17 DIAGNOSIS — E785 Hyperlipidemia, unspecified: Secondary | ICD-10-CM

## 2023-02-17 DIAGNOSIS — E1165 Type 2 diabetes mellitus with hyperglycemia: Secondary | ICD-10-CM

## 2023-02-17 DIAGNOSIS — R0789 Other chest pain: Secondary | ICD-10-CM

## 2023-02-17 LAB — CUP PACEART INCLINIC DEVICE CHECK
Battery Remaining Longevity: 49 mo
Battery Voltage: 2.96 V
Brady Statistic RA Percent Paced: 96 %
Brady Statistic RV Percent Paced: 1.7 %
Date Time Interrogation Session: 20240429130859
Implantable Lead Connection Status: 753985
Implantable Lead Connection Status: 753985
Implantable Lead Implant Date: 20090402
Implantable Lead Implant Date: 20200326
Implantable Lead Location: 753859
Implantable Lead Location: 753860
Implantable Pulse Generator Implant Date: 20200326
Lead Channel Impedance Value: 450 Ohm
Lead Channel Impedance Value: 450 Ohm
Lead Channel Pacing Threshold Amplitude: 0.75 V
Lead Channel Pacing Threshold Amplitude: 0.75 V
Lead Channel Pacing Threshold Amplitude: 1.375 V
Lead Channel Pacing Threshold Amplitude: 1.75 V
Lead Channel Pacing Threshold Pulse Width: 0.5 ms
Lead Channel Pacing Threshold Pulse Width: 0.5 ms
Lead Channel Pacing Threshold Pulse Width: 0.5 ms
Lead Channel Pacing Threshold Pulse Width: 0.5 ms
Lead Channel Sensing Intrinsic Amplitude: 2.9 mV
Lead Channel Sensing Intrinsic Amplitude: 4.2 mV
Lead Channel Setting Pacing Amplitude: 1.625
Lead Channel Setting Pacing Amplitude: 2.5 V
Lead Channel Setting Pacing Pulse Width: 0.5 ms
Lead Channel Setting Sensing Sensitivity: 0.7 mV
Pulse Gen Model: 2272
Pulse Gen Serial Number: 9120360
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
## 2023-02-17 NOTE — Telephone Encounter (Signed)
Pt here for interrogation of device. Per Dr. Bing Matter BW Direct LDL. HgBA1C, BMP drawn.

## 2023-02-18 LAB — HEMOGLOBIN A1C
Est. average glucose Bld gHb Est-mCnc: 229 mg/dL
Hgb A1c MFr Bld: 9.6 % — ABNORMAL HIGH (ref 4.8–5.6)

## 2023-02-18 LAB — BASIC METABOLIC PANEL
BUN/Creatinine Ratio: 11 (ref 10–24)
BUN: 14 mg/dL (ref 8–27)
CO2: 21 mmol/L (ref 20–29)
Calcium: 9 mg/dL (ref 8.6–10.2)
Chloride: 99 mmol/L (ref 96–106)
Creatinine, Ser: 1.23 mg/dL (ref 0.76–1.27)
Glucose: 259 mg/dL — ABNORMAL HIGH (ref 70–99)
Potassium: 5 mmol/L (ref 3.5–5.2)
Sodium: 136 mmol/L (ref 134–144)
eGFR: 62 mL/min/{1.73_m2} (ref >59)

## 2023-02-18 LAB — LDL CHOLESTEROL, DIRECT: LDL Direct: 89 mg/dL (ref 0–99)

## 2023-02-21 ENCOUNTER — Telehealth: Payer: Self-pay

## 2023-02-21 NOTE — Telephone Encounter (Signed)
Patient notified through my chart.

## 2023-02-21 NOTE — Telephone Encounter (Signed)
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  His diabetes is poorly controlled, hemoglobin A1c is 9.6 need to be less than 7, he need to follow-up with her primary care physician to get it better controlled he may benefit from endocrinologist or if he is willing to do this I do mind to refer him

## 2023-02-24 ENCOUNTER — Telehealth: Payer: Self-pay

## 2023-02-24 NOTE — Telephone Encounter (Signed)
Patient notified of results. Results forward to PCP.  °

## 2023-02-26 ENCOUNTER — Ambulatory Visit: Payer: 59 | Admitting: Orthopaedic Surgery

## 2023-02-26 ENCOUNTER — Other Ambulatory Visit (INDEPENDENT_AMBULATORY_CARE_PROVIDER_SITE_OTHER): Payer: 59

## 2023-02-26 ENCOUNTER — Encounter: Payer: Self-pay | Admitting: Orthopaedic Surgery

## 2023-02-26 DIAGNOSIS — S42035A Nondisplaced fracture of lateral end of left clavicle, initial encounter for closed fracture: Secondary | ICD-10-CM
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
## 2023-03-12 ENCOUNTER — Other Ambulatory Visit (INDEPENDENT_AMBULATORY_CARE_PROVIDER_SITE_OTHER): Payer: 59

## 2023-03-12 ENCOUNTER — Ambulatory Visit: Payer: 59 | Admitting: Orthopaedic Surgery

## 2023-03-12 DIAGNOSIS — S42035A Nondisplaced fracture of lateral end of left clavicle, initial encounter for closed fracture: Secondary | ICD-10-CM

## 2023-03-12 NOTE — Progress Notes (Signed)
Office Visit Note   Patient: Terry Joyce           Date of Birth: 1951-07-14           MRN: [id number] Visit Date: 03/12/2023              Requested by: Ralene Ok, MD [address] [place],  [place] [address] PCP: Ralene Ok, MD   Assessment & Plan: Visit Diagnoses:  1. Nondisplaced fracture of lateral end of left clavicle, initial encounter for closed fracture     Plan: Impression is 6 weeks status post left distal clavicle fracture.  He can wean the sling and begin range of motion.  He declined formal outpatient physical therapy.  Recheck in 6 weeks with two-view x-rays of the left clavicle.  Follow-Up Instructions: Return in about 6 weeks (around 04/23/2023).   Orders:  Orders Placed This Encounter  Procedures   XR Clavicle Left   No orders of the defined types were placed in this encounter.     Procedures: No procedures performed   Clinical Data: No additional findings.   Subjective: Chief Complaint  Patient presents with   Left Shoulder - Pain    HPI Patient returns today for follow-up on left distal clavicle fracture.  He is about 5 to 6 weeks from the injury.  Overall he is feeling much better. Review of Systems   Objective: Vital Signs: There were no vitals taken for this visit.  Physical Exam  Ortho Exam Examination of left shoulder shows no tenderness to the fracture site.  Range of motion is progressing nicely. Specialty Comments:  No specialty comments available.  Imaging: XR Clavicle Left  Result Date: 03/12/2023 X-rays show preserved alignment of the distal clavicle fracture.  No significant callus formation.    PMFS History: Patient Active Problem List   Diagnosis Date Noted   Dyspnea on exertion 12/20/2020   Umbilical hernia without obstruction and without gangrene 12/14/2019   Atypical chest pain 12/10/2018   Pacemaker 12/10/2018   Normal coronary arteries 04/30/2018   Pre-syncope 10/26/2017   Tachycardia 10/26/2017    Coronary artery disease involving native coronary artery of native heart without angina pectoris 07/13/2015   Bradycardia 06/29/2015   Dyslipidemia 06/29/2015   Exertional chest pain 06/29/2015   Pacemaker reprogramming/check 06/29/2015   Poorly controlled diabetes mellitus (HCC) 06/29/2015   Past Medical History:  Diagnosis Date   Atypical chest pain 12/10/2018   Bradycardia 06/29/2015   Coronary artery disease involving native coronary artery of native heart without angina pectoris 07/13/2015   Overview:  Luminal disease by cardiac catheter from September 2016   Dyslipidemia 06/29/2015   Dyspnea on exertion 12/20/2020   Exertional chest pain 06/29/2015   Normal coronary arteries 04/30/2018   Pacemaker 12/10/2018   Pacemaker reprogramming/check 06/29/2015   Poorly controlled diabetes mellitus (HCC) 06/29/2015   Pre-syncope 10/26/2017   Tachycardia 10/26/2017   Umbilical hernia without obstruction and without gangrene 12/14/2019    Family History  Problem Relation Age of Onset   Diabetes Mother    Colon cancer Father     Past Surgical History:  Procedure Laterality Date   CARDIAC CATHETERIZATION Left 07/04/2015   Surgeon: Derrek Gu, MD; Location: [facility] CATH; Service: Cardiology   CARDIAC PACEMAKER PLACEMENT     LEAD INSERTION Left 01/14/2019   Procedure: LEAD INSERTION;  Surgeon: Regan Lemming, MD;  Location: [facility] INVASIVE CV LAB;  Service: Cardiovascular;  Laterality: Left;   PPM GENERATOR CHANGEOUT N/A 01/14/2019  Procedure: PPM GENERATOR CHANGEOUT;  Surgeon: Regan Lemming, MD;  Location: [facility] INVASIVE CV LAB;  Service: Cardiovascular;  Laterality: N/A;   Social History   Occupational History   Not on file  Tobacco Use   Smoking status: Never   Smokeless tobacco: Never  Vaping Use   Vaping Use: Never used  Substance and Sexual Activity   Alcohol use: Yes   Drug use: Not Currently   Sexual activity: Not on file

## 2023-03-19 ENCOUNTER — Ambulatory Visit (INDEPENDENT_AMBULATORY_CARE_PROVIDER_SITE_OTHER): Payer: 59

## 2023-03-19 DIAGNOSIS — I495 Sick sinus syndrome: Secondary | ICD-10-CM | POA: Diagnosis not present

## 2023-03-21 LAB — CUP PACEART REMOTE DEVICE CHECK
Battery Remaining Longevity: 48 mo
Battery Remaining Percentage: 44 %
Battery Voltage: 2.98 V
Brady Statistic AP VP Percent: 1 %
Brady Statistic AP VS Percent: 99 %
Brady Statistic AS VP Percent: 1 %
Brady Statistic AS VS Percent: 1 %
Brady Statistic RA Percent Paced: 97 %
Brady Statistic RV Percent Paced: 1 %
Date Time Interrogation Session: 20240530164711
Implantable Lead Connection Status: 753985
Implantable Lead Connection Status: 753985
Implantable Lead Implant Date: 20090402
Implantable Lead Implant Date: 20200326
Implantable Lead Location: 753859
Implantable Lead Location: 753860
Implantable Pulse Generator Implant Date: 20200326
Lead Channel Impedance Value: 460 Ohm
Lead Channel Impedance Value: 480 Ohm
Lead Channel Pacing Threshold Amplitude: 0.625 V
Lead Channel Pacing Threshold Amplitude: 1.5 V
Lead Channel Pacing Threshold Pulse Width: 0.5 ms
Lead Channel Pacing Threshold Pulse Width: 0.5 ms
Lead Channel Sensing Intrinsic Amplitude: 3.7 mV
Lead Channel Sensing Intrinsic Amplitude: 5 mV
Lead Channel Setting Pacing Amplitude: 1.75 V
Lead Channel Setting Pacing Amplitude: 2.5 V
Lead Channel Setting Pacing Pulse Width: 0.5 ms
Lead Channel Setting Sensing Sensitivity: 0.7 mV
Pulse Gen Model: 2272
Pulse Gen Serial Number: 9120360

## 2023-04-10 NOTE — Progress Notes (Signed)
Remote pacemaker transmission.   

## 2023-04-23 ENCOUNTER — Ambulatory Visit: Payer: 59 | Admitting: Orthopaedic Surgery

## 2023-04-23 ENCOUNTER — Other Ambulatory Visit (INDEPENDENT_AMBULATORY_CARE_PROVIDER_SITE_OTHER): Payer: 59

## 2023-04-23 DIAGNOSIS — S42035A Nondisplaced fracture of lateral end of left clavicle, initial encounter for closed fracture: Secondary | ICD-10-CM

## 2023-04-23 NOTE — Progress Notes (Signed)
Office Visit Note   Patient: Terry Joyce           Date of Birth: 06/27/1951           MRN: [id number] Visit Date: 04/23/2023              Requested by: Ralene Ok, MD [address] [place],  [place] [address] PCP: Ralene Ok, MD   Assessment & Plan: Visit Diagnoses:  1. Nondisplaced fracture of lateral end of left clavicle, initial encounter for closed fracture     Plan: Patient is now 12 weeks status post left distal clavicle fracture.  This is healed well.  He has declined physical therapy once again.  He is happy with the result.  He feels that the pain in his shoulder is manageable.  Will see him back as needed.  Follow-Up Instructions: No follow-ups on file.   Orders:  Orders Placed This Encounter  Procedures   XR Clavicle Left   No orders of the defined types were placed in this encounter.     Procedures: No procedures performed   Clinical Data: No additional findings.   Subjective: Chief Complaint  Patient presents with   Left Shoulder - Pain    HPI Patient returns today for follow-up on left distal clavicle fracture.  Reports she has pain in the shoulder joint with activity. Review of Systems   Objective: Vital Signs: There were no vitals taken for this visit.  Physical Exam  Ortho Exam Examination of the left shoulder clavicle shows no movement through the fracture site.  No real tenderness to the fracture site.  Shoulder range of motion is normal. Specialty Comments:  No specialty comments available.  Imaging: XR Clavicle Left  Result Date: 04/23/2023 X-rays demonstrate stable alignment distal clavicle fracture with evidence of fracture consolidation.    PMFS History: Patient Active Problem List   Diagnosis Date Noted   Dyspnea on exertion 12/20/2020   Umbilical hernia without obstruction and without gangrene 12/14/2019   Atypical chest pain 12/10/2018   Pacemaker 12/10/2018   Normal coronary arteries 04/30/2018    Pre-syncope 10/26/2017   Tachycardia 10/26/2017   Coronary artery disease involving native coronary artery of native heart without angina pectoris 07/13/2015   Bradycardia 06/29/2015   Dyslipidemia 06/29/2015   Exertional chest pain 06/29/2015   Pacemaker reprogramming/check 06/29/2015   Poorly controlled diabetes mellitus (HCC) 06/29/2015   Past Medical History:  Diagnosis Date   Atypical chest pain 12/10/2018   Bradycardia 06/29/2015   Coronary artery disease involving native coronary artery of native heart without angina pectoris 07/13/2015   Overview:  Luminal disease by cardiac catheter from September 2016   Dyslipidemia 06/29/2015   Dyspnea on exertion 12/20/2020   Exertional chest pain 06/29/2015   Normal coronary arteries 04/30/2018   Pacemaker 12/10/2018   Pacemaker reprogramming/check 06/29/2015   Poorly controlled diabetes mellitus (HCC) 06/29/2015   Pre-syncope 10/26/2017   Tachycardia 10/26/2017   Umbilical hernia without obstruction and without gangrene 12/14/2019    Family History  Problem Relation Age of Onset   Diabetes Mother    Colon cancer Father     Past Surgical History:  Procedure Laterality Date   CARDIAC CATHETERIZATION Left 07/04/2015   Surgeon: Derrek Gu, MD; Location: [facility] CATH; Service: Cardiology   CARDIAC PACEMAKER PLACEMENT     LEAD INSERTION Left 01/14/2019   Procedure: LEAD INSERTION;  Surgeon: Regan Lemming, MD;  Location: [facility] INVASIVE CV LAB;  Service: Cardiovascular;  Laterality: Left;  PPM GENERATOR CHANGEOUT N/A 01/14/2019   Procedure: PPM GENERATOR CHANGEOUT;  Surgeon: Regan Lemming, MD;  Location: [facility] INVASIVE CV LAB;  Service: Cardiovascular;  Laterality: N/A;   Social History   Occupational History   Not on file  Tobacco Use   Smoking status: Never   Smokeless tobacco: Never  Vaping Use   Vaping Use: Never used  Substance and Sexual Activity   Alcohol use: Yes   Drug use: Not Currently   Sexual activity: Not on file

## 2023-06-18 ENCOUNTER — Ambulatory Visit (INDEPENDENT_AMBULATORY_CARE_PROVIDER_SITE_OTHER): Payer: 59

## 2023-06-18 DIAGNOSIS — I495 Sick sinus syndrome: Secondary | ICD-10-CM

## 2023-06-19 LAB — CUP PACEART REMOTE DEVICE CHECK
Battery Remaining Longevity: 44 mo
Battery Remaining Percentage: 42 %
Battery Voltage: 2.98 V
Brady Statistic AP VP Percent: 1 %
Brady Statistic AP VS Percent: 98 %
Brady Statistic AS VP Percent: 1 %
Brady Statistic AS VS Percent: 1.4 %
Brady Statistic RA Percent Paced: 97 %
Brady Statistic RV Percent Paced: 1 %
Date Time Interrogation Session: 20240828020013
Implantable Lead Connection Status: 753985
Implantable Lead Connection Status: 753985
Implantable Lead Implant Date: 20090402
Implantable Lead Implant Date: 20200326
Implantable Lead Location: 753859
Implantable Lead Location: 753860
Implantable Pulse Generator Implant Date: 20200326
Lead Channel Impedance Value: 450 Ohm
Lead Channel Impedance Value: 460 Ohm
Lead Channel Pacing Threshold Amplitude: 0.625 V
Lead Channel Pacing Threshold Amplitude: 1.5 V
Lead Channel Pacing Threshold Pulse Width: 0.5 ms
Lead Channel Pacing Threshold Pulse Width: 0.5 ms
Lead Channel Sensing Intrinsic Amplitude: 3.3 mV
Lead Channel Sensing Intrinsic Amplitude: 4.9 mV
Lead Channel Setting Pacing Amplitude: 1.75 V
Lead Channel Setting Pacing Amplitude: 2.5 V
Lead Channel Setting Pacing Pulse Width: 0.5 ms
Lead Channel Setting Sensing Sensitivity: 0.7 mV
Pulse Gen Model: 2272
Pulse Gen Serial Number: 9120360

## 2023-06-27 NOTE — Progress Notes (Signed)
Remote pacemaker transmission.   

## 2023-09-17 ENCOUNTER — Ambulatory Visit: Payer: 59

## 2023-09-17 DIAGNOSIS — I495 Sick sinus syndrome: Secondary | ICD-10-CM | POA: Diagnosis not present

## 2023-09-17 LAB — CUP PACEART REMOTE DEVICE CHECK
Battery Remaining Longevity: 41 mo
Battery Remaining Percentage: 39 %
Battery Voltage: 2.98 V
Brady Statistic AP VP Percent: 1 %
Brady Statistic AP VS Percent: 99 %
Brady Statistic AS VP Percent: 1 %
Brady Statistic AS VS Percent: 1 %
Brady Statistic RA Percent Paced: 97 %
Brady Statistic RV Percent Paced: 1 %
Date Time Interrogation Session: 20241127020014
Implantable Lead Connection Status: 753985
Implantable Lead Connection Status: 753985
Implantable Lead Implant Date: 20090402
Implantable Lead Implant Date: 20200326
Implantable Lead Location: 753859
Implantable Lead Location: 753860
Implantable Pulse Generator Implant Date: 20200326
Lead Channel Impedance Value: 430 Ohm
Lead Channel Impedance Value: 440 Ohm
Lead Channel Pacing Threshold Amplitude: 0.625 V
Lead Channel Pacing Threshold Amplitude: 1.25 V
Lead Channel Pacing Threshold Pulse Width: 0.5 ms
Lead Channel Pacing Threshold Pulse Width: 0.5 ms
Lead Channel Sensing Intrinsic Amplitude: 3.3 mV
Lead Channel Sensing Intrinsic Amplitude: 4.7 mV
Lead Channel Setting Pacing Amplitude: 1.5 V
Lead Channel Setting Pacing Amplitude: 2.5 V
Lead Channel Setting Pacing Pulse Width: 0.5 ms
Lead Channel Setting Sensing Sensitivity: 0.7 mV
Pulse Gen Model: 2272
Pulse Gen Serial Number: 9120360

## 2023-12-17 ENCOUNTER — Ambulatory Visit (INDEPENDENT_AMBULATORY_CARE_PROVIDER_SITE_OTHER): Payer: 59

## 2023-12-17 DIAGNOSIS — I495 Sick sinus syndrome: Secondary | ICD-10-CM

## 2023-12-19 LAB — CUP PACEART REMOTE DEVICE CHECK
Battery Remaining Longevity: 40 mo
Battery Remaining Percentage: 37 %
Battery Voltage: 2.96 V
Brady Statistic AP VP Percent: 1 %
Brady Statistic AP VS Percent: 99 %
Brady Statistic AS VP Percent: 1 %
Brady Statistic AS VS Percent: 1 %
Brady Statistic RA Percent Paced: 97 %
Brady Statistic RV Percent Paced: 1 %
Date Time Interrogation Session: 20250228000124
Implantable Lead Connection Status: 753985
Implantable Lead Connection Status: 753985
Implantable Lead Implant Date: 20090402
Implantable Lead Implant Date: 20200326
Implantable Lead Location: 753859
Implantable Lead Location: 753860
Implantable Pulse Generator Implant Date: 20200326
Lead Channel Impedance Value: 490 Ohm
Lead Channel Impedance Value: 490 Ohm
Lead Channel Pacing Threshold Amplitude: 0.625 V
Lead Channel Pacing Threshold Amplitude: 1.625 V
Lead Channel Pacing Threshold Pulse Width: 0.5 ms
Lead Channel Pacing Threshold Pulse Width: 0.5 ms
Lead Channel Sensing Intrinsic Amplitude: 3.6 mV
Lead Channel Sensing Intrinsic Amplitude: 5.1 mV
Lead Channel Setting Pacing Amplitude: 1.875
Lead Channel Setting Pacing Amplitude: 2.5 V
Lead Channel Setting Pacing Pulse Width: 0.5 ms
Lead Channel Setting Sensing Sensitivity: 0.7 mV
Pulse Gen Model: 2272
Pulse Gen Serial Number: 9120360

## 2024-01-21 NOTE — Addendum Note (Signed)
 Addended by: Elease Etienne A on: 01/21/2024 09:05 AM   Modules accepted: Orders

## 2024-01-21 NOTE — Progress Notes (Signed)
 Remote pacemaker transmission.

## 2024-03-17 ENCOUNTER — Ambulatory Visit (INDEPENDENT_AMBULATORY_CARE_PROVIDER_SITE_OTHER): Payer: 59

## 2024-03-17 DIAGNOSIS — I495 Sick sinus syndrome: Secondary | ICD-10-CM

## 2024-03-18 LAB — CUP PACEART REMOTE DEVICE CHECK
Battery Remaining Longevity: 35 mo
Battery Remaining Percentage: 34 %
Battery Voltage: 2.96 V
Brady Statistic AP VP Percent: 1 %
Brady Statistic AP VS Percent: 99 %
Brady Statistic AS VP Percent: 1 %
Brady Statistic AS VS Percent: 1 %
Brady Statistic RA Percent Paced: 97 %
Brady Statistic RV Percent Paced: 1 %
Date Time Interrogation Session: 20250528034254
Implantable Lead Connection Status: 753985
Implantable Lead Connection Status: 753985
Implantable Lead Implant Date: 20090402
Implantable Lead Implant Date: 20200326
Implantable Lead Location: 753859
Implantable Lead Location: 753860
Implantable Pulse Generator Implant Date: 20200326
Lead Channel Impedance Value: 440 Ohm
Lead Channel Impedance Value: 440 Ohm
Lead Channel Pacing Threshold Amplitude: 0.625 V
Lead Channel Pacing Threshold Amplitude: 1.375 V
Lead Channel Pacing Threshold Pulse Width: 0.5 ms
Lead Channel Pacing Threshold Pulse Width: 0.5 ms
Lead Channel Sensing Intrinsic Amplitude: 2.4 mV
Lead Channel Sensing Intrinsic Amplitude: 5 mV
Lead Channel Setting Pacing Amplitude: 1.625
Lead Channel Setting Pacing Amplitude: 2.5 V
Lead Channel Setting Pacing Pulse Width: 0.5 ms
Lead Channel Setting Sensing Sensitivity: 0.7 mV
Pulse Gen Model: 2272
Pulse Gen Serial Number: 9120360

## 2024-03-21 ENCOUNTER — Ambulatory Visit: Payer: Self-pay | Admitting: Cardiology

## 2024-05-10 NOTE — Progress Notes (Signed)
 Remote pacemaker transmission.

## 2024-05-10 NOTE — Addendum Note (Signed)
 Addended by: Kasaundra Fahrney A on: 05/10/2024 03:04 PM   Modules accepted: Orders

## 2024-06-16 ENCOUNTER — Ambulatory Visit (INDEPENDENT_AMBULATORY_CARE_PROVIDER_SITE_OTHER)

## 2024-06-16 DIAGNOSIS — I495 Sick sinus syndrome: Secondary | ICD-10-CM | POA: Diagnosis not present

## 2024-06-16 LAB — CUP PACEART REMOTE DEVICE CHECK
Battery Remaining Longevity: 32 mo
Battery Remaining Percentage: 31 %
Battery Voltage: 2.96 V
Brady Statistic AP VP Percent: 1 %
Brady Statistic AP VS Percent: 99 %
Brady Statistic AS VP Percent: 1 %
Brady Statistic AS VS Percent: 1 %
Brady Statistic RA Percent Paced: 97 %
Brady Statistic RV Percent Paced: 1 %
Date Time Interrogation Session: 20250827042219
Implantable Lead Connection Status: 753985
Implantable Lead Connection Status: 753985
Implantable Lead Implant Date: 20090402
Implantable Lead Implant Date: 20200326
Implantable Lead Location: 753859
Implantable Lead Location: 753860
Implantable Pulse Generator Implant Date: 20200326
Lead Channel Impedance Value: 440 Ohm
Lead Channel Impedance Value: 440 Ohm
Lead Channel Pacing Threshold Amplitude: 0.625 V
Lead Channel Pacing Threshold Amplitude: 1.875 V
Lead Channel Pacing Threshold Pulse Width: 0.5 ms
Lead Channel Pacing Threshold Pulse Width: 0.5 ms
Lead Channel Sensing Intrinsic Amplitude: 3.5 mV
Lead Channel Sensing Intrinsic Amplitude: 4 mV
Lead Channel Setting Pacing Amplitude: 2.125
Lead Channel Setting Pacing Amplitude: 2.5 V
Lead Channel Setting Pacing Pulse Width: 0.5 ms
Lead Channel Setting Sensing Sensitivity: 0.7 mV
Pulse Gen Model: 2272
Pulse Gen Serial Number: 9120360

## 2024-06-17 ENCOUNTER — Ambulatory Visit: Payer: Self-pay | Admitting: Cardiology

## 2024-07-05 NOTE — Progress Notes (Signed)
 Remote PPM Transmission

## 2024-09-15 ENCOUNTER — Ambulatory Visit

## 2024-09-15 DIAGNOSIS — I495 Sick sinus syndrome: Secondary | ICD-10-CM | POA: Diagnosis not present

## 2024-09-21 LAB — CUP PACEART REMOTE DEVICE CHECK
Battery Remaining Longevity: 30 mo
Battery Remaining Percentage: 29 %
Battery Voltage: 2.95 V
Brady Statistic AP VP Percent: 1 %
Brady Statistic AP VS Percent: 99 %
Brady Statistic AS VP Percent: 1 %
Brady Statistic AS VS Percent: 1 %
Brady Statistic RA Percent Paced: 97 %
Brady Statistic RV Percent Paced: 1 %
Date Time Interrogation Session: 20251201201207
Implantable Lead Connection Status: 753985
Implantable Lead Connection Status: 753985
Implantable Lead Implant Date: 20090402
Implantable Lead Implant Date: 20200326
Implantable Lead Location: 753859
Implantable Lead Location: 753860
Implantable Pulse Generator Implant Date: 20200326
Lead Channel Impedance Value: 440 Ohm
Lead Channel Impedance Value: 450 Ohm
Lead Channel Pacing Threshold Amplitude: 0.625 V
Lead Channel Pacing Threshold Amplitude: 1.5 V
Lead Channel Pacing Threshold Pulse Width: 0.5 ms
Lead Channel Pacing Threshold Pulse Width: 0.5 ms
Lead Channel Sensing Intrinsic Amplitude: 1.2 mV
Lead Channel Sensing Intrinsic Amplitude: 4.6 mV
Lead Channel Setting Pacing Amplitude: 1.75 V
Lead Channel Setting Pacing Amplitude: 2.5 V
Lead Channel Setting Pacing Pulse Width: 0.5 ms
Lead Channel Setting Sensing Sensitivity: 0.7 mV
Pulse Gen Model: 2272
Pulse Gen Serial Number: 9120360

## 2024-09-22 NOTE — Progress Notes (Signed)
 Remote PPM Transmission

## 2024-09-23 ENCOUNTER — Ambulatory Visit: Payer: Self-pay | Admitting: Cardiology

## 2024-12-15 ENCOUNTER — Ambulatory Visit

## 2025-03-16 ENCOUNTER — Ambulatory Visit

## 2025-06-15 ENCOUNTER — Ambulatory Visit
